# Patient Record
Sex: Female | Born: 1995 | Race: Black or African American | Hispanic: No | Marital: Single | Smoking: Former smoker
Health system: Southern US, Community
[De-identification: ages and names within clinical notes are randomized; demographics above are authoritative.]

## PROBLEM LIST (undated history)

## (undated) ENCOUNTER — Inpatient Hospital Stay (HOSPITAL_COMMUNITY): Payer: Self-pay

## (undated) DIAGNOSIS — O24419 Gestational diabetes mellitus in pregnancy, unspecified control: Secondary | ICD-10-CM

## (undated) DIAGNOSIS — Z789 Other specified health status: Secondary | ICD-10-CM

## (undated) DIAGNOSIS — K429 Umbilical hernia without obstruction or gangrene: Secondary | ICD-10-CM

## (undated) DIAGNOSIS — O139 Gestational [pregnancy-induced] hypertension without significant proteinuria, unspecified trimester: Secondary | ICD-10-CM

## (undated) DIAGNOSIS — E119 Type 2 diabetes mellitus without complications: Secondary | ICD-10-CM

## (undated) DIAGNOSIS — N12 Tubulo-interstitial nephritis, not specified as acute or chronic: Secondary | ICD-10-CM

## (undated) DIAGNOSIS — J40 Bronchitis, not specified as acute or chronic: Secondary | ICD-10-CM

## (undated) HISTORY — DX: Bronchitis, not specified as acute or chronic: J40

## (undated) HISTORY — PX: MULTIPLE TOOTH EXTRACTIONS: SHX2053

## (undated) HISTORY — DX: Type 2 diabetes mellitus without complications: E11.9

## (undated) HISTORY — PX: NO PAST SURGERIES: SHX2092

## (undated) HISTORY — DX: Umbilical hernia without obstruction or gangrene: K42.9

---
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
## 2014-07-24 ENCOUNTER — Emergency Department (HOSPITAL_COMMUNITY)
Admission: EM | Admit: 2014-07-24 | Discharge: 2014-07-24 | Disposition: A | Discharge status: Home / Self Care | Payer: Medicaid Other | Attending: Emergency Medicine | Admitting: Emergency Medicine

## 2014-07-24 ENCOUNTER — Encounter (HOSPITAL_COMMUNITY): Payer: Self-pay | Admitting: Emergency Medicine

## 2014-07-24 DIAGNOSIS — Z720 Tobacco use: Secondary | ICD-10-CM | POA: Diagnosis not present

## 2014-07-24 DIAGNOSIS — K029 Dental caries, unspecified: Secondary | ICD-10-CM

## 2014-07-24 DIAGNOSIS — Z792 Long term (current) use of antibiotics: Secondary | ICD-10-CM | POA: Insufficient documentation

## 2014-07-24 DIAGNOSIS — K088 Other specified disorders of teeth and supporting structures: Secondary | ICD-10-CM | POA: Diagnosis not present

## 2014-07-24 MED ORDER — AMOXICILLIN 500 MG PO CAPS: 500.0000 mg | ORAL_CAPSULE | Freq: Three times a day (TID) | ORAL | Status: DC

## 2014-07-24 MED ORDER — HYDROCODONE-ACETAMINOPHEN 5-325 MG PO TABS: ORAL_TABLET | ORAL | Status: DC

## 2014-07-24 MED ORDER — BUPIVACAINE-EPINEPHRINE (PF) 0.5% -1:200000 IJ SOLN
1.8000 mL | Freq: Once | INTRAMUSCULAR | Status: AC
  Administered 2014-07-24: 1.8 mL

## 2014-07-24 MED ORDER — AMOXICILLIN 500 MG PO CAPS
500.0000 mg | ORAL_CAPSULE | Freq: Once | ORAL | Status: AC
  Administered 2014-07-24: 500 mg via ORAL
  Filled 2014-07-24: qty 1

## 2014-07-24 NOTE — ED Notes (Signed)
Pt. reports left lower molar pain onset this week unrelieved by OTC pain medication .

## 2014-07-24 NOTE — ED Provider Notes (Signed)
CSN: [id number]     Arrival date & time 07/24/14  2202 History  This chart was scribed for non-physician practitioner, Wynetta Emery, PA-C, working with Gilda Crease, MD, by Bronson Curb, ED Scribe. This patient was seen in room TR04C/TR04C and the patient's care was started at 10:30 PM.     Chief Complaint  Patient presents with  . Dental Pain    The history is provided by the patient. No language interpreter was used.     HPI Comments: Debbie Mccullough is a 19 y.o. female, with no significant past medical history, who presents to the Emergency Department complaining of constant, left, lower dental pain since yesterday, that has gotten worse today. Patient rates her pain a 9/10, currently. She has taken Tylenol without relief. She reports history of the same, but states this is worse. No aggravating or alleviating factors noted. Denies fever/chills, difficulty opening jaw, difficulty swallowing, SOB, gum swelling, facial swelling, neck swelling.    History reviewed. No pertinent past medical history. History reviewed. No pertinent past surgical history. No family history on file. History  Substance Use Topics  . Smoking status: Current Every Day Smoker  . Smokeless tobacco: Not on file  . Alcohol Use: Yes   OB History    No data available     Review of Systems  A complete 10 system review of systems was obtained and all systems are negative except as noted in the HPI and PMH.    Allergies  Review of patient's allergies indicates no known allergies.  Home Medications   Prior to Admission medications   Medication Sig Start Date End Date Taking? Authorizing Provider  ibuprofen (ADVIL,MOTRIN) 200 MG tablet Take 400 mg by mouth every 6 (six) hours as needed for moderate pain.    Historical Provider, MD  penicillin v potassium (VEETID) 500 MG tablet Take 1 tablet (500 mg total) by mouth 4 (four) times daily. 04/05/13   Kaitlyn Szekalski, PA-C  traMADol (ULTRAM) 50  MG tablet Take 1 tablet (50 mg total) by mouth every 6 (six) hours as needed. 04/05/13   Emilia Beck, PA-C   Triage Vitals: BP 133/92 mmHg  Pulse 76  Temp(Src) 99.5 F (37.5 C) (Oral)  Resp 14  Ht  (1.575 m)  Wt 175 lb (79.379 kg)  BMI 32.00 kg/m2  SpO2 100%  LMP 07/18/2014  Physical Exam  Constitutional: She is oriented to person, place, and time. She appears well-developed and well-nourished. No distress.  Tearful  HENT:  Head: Normocephalic and atraumatic.  Mouth/Throat: Oropharynx is clear and moist.  Generally poor dentition, no gingival swelling, erythema or tenderness to palpation. Patient is handling their secretions. There is no tenderness to palpation or firmness underneath tongue bilaterally. No trismus.    Eyes: Conjunctivae and EOM are normal. Pupils are equal, round, and reactive to light.  Neck: Neck supple. No tracheal deviation present.  Cardiovascular: Normal rate.   Pulmonary/Chest: Effort normal. No respiratory distress.  Abdominal: Soft.  Musculoskeletal: Normal range of motion.  Neurological: She is alert and oriented to person, place, and time.  Skin: Skin is warm and dry.  Psychiatric: She has a normal mood and affect. Her behavior is normal.  Nursing note and vitals reviewed.   ED Course  NERVE BLOCK Date/Time: 07/25/2014 1:59 AM Performed by: Wynetta Emery Authorized by: Wynetta Emery Consent: Verbal consent obtained. Consent given by: patient Patient identity confirmed: verbally with patient Indications: pain relief Body area: face/mouth Nerve: inferior alveolar Laterality: left Patient  sedated: no Patient position: sitting Needle gauge: 27 G Location technique: anatomical landmarks Local anesthetic: bupivacaine 0.5% with epinephrine Anesthetic total: 1.8 ml Outcome: pain improved Patient tolerance: Patient tolerated the procedure well with no immediate complications   (including critical care time)  DIAGNOSTIC  STUDIES: Oxygen Saturation is 100% on room air, normal by my interpretation.    COORDINATION OF CARE: At 2232 Discussed treatment plan with patient which includes dental block, pain medication, ABX, and dental referral. Patient agrees.   Labs Review Labs Reviewed - No data to display  Imaging Review No results found.   EKG Interpretation None      MDM   Final diagnoses:  Pain due to dental caries    Filed Vitals:   07/24/14 2209  BP: 133/92  Pulse: 76  Temp: 99.5 F (37.5 C)  TempSrc: Oral  Resp: 14  Height: 5\' 2"  (1.575 m)  Weight: 175 lb (79.379 kg)  SpO2: 100%    Medications  bupivacaine-epinephrine (MARCAINE W/ EPI) 0.5% -1:200000 injection 1.8 mL (1.8 mLs Infiltration Given 07/24/14 2244)  amoxicillin (AMOXIL) capsule 500 mg (500 mg Oral Given 07/24/14 2244)    Debbie Mccullough is a pleasant 19 y.o. female presenting with dental pain associated with dental caries but no signs or symptoms of dental abscess. Patient afebrile, non toxic appearing and swallowing secretions well. I gave patient referral to dentist and stressed the importance of dental follow up for definitive management of dental issues. Patient voices understanding and is agreeable to plan.  tion does not show pathology that would require ongoing emergent intervention or inpatient treatment. Pt is hemodynamically stable and mentating appropriately. Discussed findings and plan with patient/guardian, who agrees with care plan. All questions answered. Return precautions discussed and outpatient follow up given.   Discharge Medication List as of 07/24/2014 10:47 PM    START taking these medications   Details  amoxicillin (AMOXIL) 500 MG capsule Take 1 capsule (500 mg total) by mouth 3 (three) times daily., Starting 07/24/2014, Until Discontinued, Print    HYDROcodone-acetaminophen (NORCO/VICODIN) 5-325 MG per tablet Take 1-2 tablets by mouth every 6 hours as needed for pain and/or cough., Print              Wynetta Emeryicole Ivaan Liddy, PA-C 07/25/14 0201  Gilda Creasehristopher J Pollina, MD 07/27/14 [phone]

## 2014-07-24 NOTE — Discharge Instructions (Signed)
Take percocet for breakthrough pain, do not drink alcohol, drive, care for children or do other critical tasks while taking percocet.  Return to the emergency room for fever, change in vision, redness to the face that rapidly spreads towards the eye, nausea or vomiting, difficulty swallowing or shortness of breath.   Apply warm compresses to jaw throughout the day.   Take your antibiotics as directed and to the end of the course.   Followup with a dentist is very important for ongoing evaluation and management of recurrent dental pain. Return to emergency department for emergent changing or worsening symptoms."  Low-cost dental clinic: [facility]**David  Civils  at [phone]**  **Nuala AlphaJanna Civils at [phone] [address]**     Dental Caries Dental caries is tooth decay. This decay can cause a hole in teeth (cavity) that can get bigger and deeper over time. HOME CARE  Brush and floss your teeth. Do this at least two times a day.  Use a fluoride toothpaste.  Use a mouth rinse if told by your dentist or doctor.  Eat less sugary and starchy foods. Drink less sugary drinks.  Avoid snacking often on sugary and starchy foods. Avoid sipping often on sugary drinks.  Keep regular checkups and cleanings with your dentist.  Use fluoride supplements if told by your dentist or doctor.  Allow fluoride to be applied to teeth if told by your dentist or doctor. Document Released: 01/14/2008 Document Revised: 08/21/2013 Document Reviewed: 04/08/2012 [facility] Patient Information 2015 [employer] [place]. This information is not intended to replace advice given to you by your health care provider. Make sure you discuss any questions you have with your health care provider.   You may also call [phone]  Dental Assistance If the dentist on-call cannot see you, please use the resources below:   Patients with Medicaid: [facility] [phone],  [phone] [address], [phone]  If unable to pay, or uninsured, contact [facility] [phone]) or [facility] [phone] in [place], [phone] in [place]) to become qualified for the adult dental clinic  Other Low-Cost Community Dental Services: [facility]- [address], [place], [place], [id number]    [phone], Ext. [phone]    2nd and 4th Thursday of the month at 6:30am    10 clients each day by appointment, can sometimes see walk-in     patients if someone does not show for an appointment [facility]- [address], [place], [place], [id number]    [phone] [facility]- [address], [place], [place], [address]    [phone]  [facility]- [phone] [facility]- [phone] [facility]- [phone]

## 2014-08-05 ENCOUNTER — Encounter (HOSPITAL_COMMUNITY): Payer: Self-pay | Admitting: *Deleted

## 2014-08-05 ENCOUNTER — Emergency Department (HOSPITAL_COMMUNITY)
Admission: EM | Admit: 2014-08-05 | Discharge: 2014-08-05 | Disposition: A | Discharge status: Home / Self Care | Payer: Medicaid Other | Attending: Emergency Medicine | Admitting: Emergency Medicine

## 2014-08-05 DIAGNOSIS — K088 Other specified disorders of teeth and supporting structures: Secondary | ICD-10-CM | POA: Diagnosis not present

## 2014-08-05 DIAGNOSIS — K0381 Cracked tooth: Secondary | ICD-10-CM | POA: Diagnosis not present

## 2014-08-05 DIAGNOSIS — K0889 Other specified disorders of teeth and supporting structures: Secondary | ICD-10-CM

## 2014-08-05 DIAGNOSIS — Z720 Tobacco use: Secondary | ICD-10-CM | POA: Diagnosis not present

## 2014-08-05 MED ORDER — PENICILLIN V POTASSIUM 500 MG PO TABS: 500.0000 mg | ORAL_TABLET | Freq: Four times a day (QID) | ORAL | Status: AC

## 2014-08-05 MED ORDER — IBUPROFEN 600 MG PO TABS: 600.0000 mg | ORAL_TABLET | Freq: Four times a day (QID) | ORAL | Status: DC | PRN

## 2014-08-05 NOTE — ED Notes (Signed)
Swollen lt face this am toothache

## 2014-08-05 NOTE — ED Notes (Signed)
Declined W/C at D/C and was escorted to lobby by RN. 

## 2014-08-05 NOTE — Discharge Instructions (Signed)
Dental Care and Dentist Visits °Dental care supports good overall health. Regular dental visits can also help you avoid dental pain, bleeding, infection, and other more serious health problems in the future. It is important to keep the mouth healthy because diseases in the teeth, gums, and other oral tissues can spread to other areas of the body. Some problems, such as diabetes, heart disease, and pre-term labor have been associated with poor oral health.  °See your dentist every 6 months. If you experience emergency problems such as a toothache or broken tooth, go to the dentist right away. If you see your dentist regularly, you may catch problems early. It is easier to be treated for problems in the early stages.  °WHAT TO EXPECT AT A DENTIST VISIT  °Your dentist will look for many common oral health problems and recommend proper treatment. At your regular dental visit, you can expect: °· Gentle cleaning of the teeth and gums. This includes scraping and polishing. This helps to remove the sticky substance around the teeth and gums (plaque). Plaque forms in the mouth shortly after eating. Over time, plaque hardens on the teeth as tartar. If tartar is not removed regularly, it can cause problems. Cleaning also helps remove stains. °· Periodic X-rays. These pictures of the teeth and supporting bone will help your dentist assess the health of your teeth. °· Periodic fluoride treatments. Fluoride is a natural mineral shown to help strengthen teeth. Fluoride treatment involves applying a fluoride gel or varnish to the teeth. It is most commonly done in children. °· Examination of the mouth, tongue, jaws, teeth, and gums to look for any oral health problems, such as: °· Cavities (dental caries). This is decay on the tooth caused by plaque, sugar, and acid in the mouth. It is best to catch a cavity when it is small. °· Inflammation of the gums caused by plaque buildup (gingivitis). °· Problems with the mouth or malformed  or misaligned teeth. °· Oral cancer or other diseases of the soft tissues or jaws.  °KEEP YOUR TEETH AND GUMS HEALTHY °For healthy teeth and gums, follow these general guidelines as well as your dentist's specific advice: °· Have your teeth professionally cleaned at the dentist every 6 months. °· Brush twice daily with a fluoride toothpaste. °· Floss your teeth daily.  °· Ask your dentist if you need fluoride supplements, treatments, or fluoride toothpaste. °· Eat a healthy diet. Reduce foods and drinks with added sugar. °· Avoid smoking. °TREATMENT FOR ORAL HEALTH PROBLEMS °If you have oral health problems, treatment varies depending on the conditions present in your teeth and gums. °· Your caregiver will most likely recommend good oral hygiene at each visit. °· For cavities, gingivitis, or other oral health disease, your caregiver will perform a procedure to treat the problem. This is typically done at a separate appointment. Sometimes your caregiver will refer you to another dental specialist for specific tooth problems or for surgery. °SEEK IMMEDIATE DENTAL CARE IF: °· You have pain, bleeding, or soreness in the gum, tooth, jaw, or mouth area. °· A permanent tooth becomes loose or separated from the gum socket. °· You experience a blow or injury to the mouth or jaw area. °Document Released: 12/17/2010 Document Revised: 06/29/2011 Document Reviewed: 12/17/2010 °ExitCare® Patient Information ©2015 [employer]. This information is not intended to replace advice given to you by your health care provider. Make sure you discuss any questions you have with your health care provider. ° °Dental Pain °A tooth ache may be caused   by cavities (tooth decay). Cavities expose the nerve of the tooth to air and hot or cold temperatures. It may come from an infection or abscess (also called a boil or furuncle) around your tooth. It is also often caused by dental caries (tooth decay). This causes the pain you are  having. °DIAGNOSIS  °Your caregiver can diagnose this problem by exam. °TREATMENT  °· If caused by an infection, it may be treated with medications which kill germs (antibiotics) and pain medications as prescribed by your caregiver. Take medications as directed. °· Only take over-the-counter or prescription medicines for pain, discomfort, or fever as directed by your caregiver. °· Whether the tooth ache today is caused by infection or dental disease, you should see your dentist as soon as possible for further care. °SEEK MEDICAL CARE IF: °The exam and treatment you received today has been provided on an emergency basis only. This is not a substitute for complete medical or dental care. If your problem worsens or new problems (symptoms) appear, and you are unable to meet with your dentist, call or return to this location. °SEEK IMMEDIATE MEDICAL CARE IF:  °· You have a fever. °· You develop redness and swelling of your face, jaw, or neck. °· You are unable to open your mouth. °· You have severe pain uncontrolled by pain medicine. °MAKE SURE YOU:  °· Understand these instructions. °· Will watch your condition. °· Will get help right away if you are not doing well or get worse. °Document Released: 04/06/2005 Document Revised: 06/29/2011 Document Reviewed: 11/23/2007 °ExitCare® Patient Information ©2015 [employer]. This information is not intended to replace advice given to you by your health care provider. Make sure you discuss any questions you have with your health care provider. ° °

## 2014-08-05 NOTE — ED Provider Notes (Signed)
CSN: [id number]     Arrival date & time 08/05/14  1804 History  This chart is scribed for non-physician practitioner, Joycie Peek, PA-C, working with Jerelyn Scott, MD by Abel Presto, ED Scribe.  This patient was seen in room TR06C/TR06C and the patient's care was started 6:24 PM.     Chief Complaint  Patient presents with  . Dental Problem     The history is provided by the patient. No language interpreter was used.   HPI Comments: Debbie Mccullough is a 19 y.o. female who presents to the Emergency Department complaining of constant 5/10 left lower dental pain with onset 13 days ago. Pt states the tooth has been cracked for 2 years. Pt notes associated jaw pain (but is able to move jaw well), and facial swelling to left side near ear with onset this afternoon. Pt was seen on 07/24/14 in ED for same and was discharged with Amoxicillin and hydrocodone. Pt has not taken medication for relief. Pt has an appointment with a dentist in 3 days.  Pt denies SOB, trouble swallowing, fever, nausea, vomiting, sore throat, and ear pain.   History reviewed. No pertinent past medical history. No past surgical history on file. No family history on file. History  Substance Use Topics  . Smoking status: Current Every Day Smoker  . Smokeless tobacco: Not on file  . Alcohol Use: Yes   OB History    No data available     Review of Systems  Constitutional: Negative for fever.  HENT: Positive for dental problem and facial swelling. Negative for ear pain, sore throat and trouble swallowing.   Respiratory: Negative for shortness of breath.   Gastrointestinal: Negative for nausea and vomiting.  All other systems reviewed and are negative.     Allergies  Review of patient's allergies indicates no known allergies.  Home Medications   Prior to Admission medications   Medication Sig Start Date End Date Taking? Authorizing Provider  amoxicillin (AMOXIL) 500 MG capsule Take 1 capsule (500 mg  total) by mouth 3 (three) times daily. 07/24/14   Nicole Pisciotta, PA-C  HYDROcodone-acetaminophen (NORCO/VICODIN) 5-325 MG per tablet Take 1-2 tablets by mouth every 6 hours as needed for pain and/or cough. 07/24/14   Nicole Pisciotta, PA-C  ibuprofen (ADVIL,MOTRIN) 600 MG tablet Take 1 tablet (600 mg total) by mouth every 6 (six) hours as needed. 08/05/14   Joycie Peek, PA-C  penicillin v potassium (VEETID) 500 MG tablet Take 1 tablet (500 mg total) by mouth 4 (four) times daily. 08/05/14 08/12/14  Joycie Peek, PA-C  traMADol (ULTRAM) 50 MG tablet Take 1 tablet (50 mg total) by mouth every 6 (six) hours as needed. 04/05/13   Kaitlyn Szekalski, PA-C   BP 114/61 mmHg  Pulse 67  Temp(Src) 99 F (37.2 C) (Oral)  Resp 20  Wt 171 lb (77.565 kg)  SpO2 99%  LMP 07/18/2014 Physical Exam  Constitutional: She is oriented to person, place, and time. She appears well-developed and well-nourished.  HENT:  Head: Normocephalic.  Right Ear: Tympanic membrane normal.  Left Ear: Tympanic membrane normal.  No tenderness over the mastoid. Mild left cheek swelling Discomfort located to the left mandibular posterior molar. No submandibular pain Mucous membranes are moist. No unilateral tonsillar swelling, uvula midline, no glossal swelling or elevation. No trismus. No fluctuance or evidence of a drainable abscess. No other evidence of emergent infection, Retropharyngeal or Peritonsillar abscess, Ludwig or Vincents angina. Tolerating secretions well. Patent airway   Eyes: Conjunctivae are normal.  Neck: Normal range of motion. Neck supple.  Pulmonary/Chest: Effort normal.  Musculoskeletal: Normal range of motion.  Lymphadenopathy:       Head (right side): No preauricular and no posterior auricular adenopathy present.       Head (left side): No preauricular and no posterior auricular adenopathy present.    She has no cervical adenopathy.  Neurological: She is alert and oriented to person, place, and  time.  Skin: Skin is warm and dry.  Psychiatric: She has a normal mood and affect. Her behavior is normal.  Nursing note and vitals reviewed.   ED Course  Procedures (including critical care time) DIAGNOSTIC STUDIES: Oxygen Saturation is 100% on room air, normal by my interpretation.    COORDINATION OF CARE: 6:30 PM Discussed treatment plan with patient at beside, the patient agrees with the plan and has no further questions at this time.   Labs Review Labs Reviewed - No data to display  Imaging Review No results found.   EKG Interpretation None     Meds given in ED:  Medications - No data to display  Discharge Medication List as of 08/05/2014  6:31 PM     Filed Vitals:   08/05/14 1811 08/05/14 1834  BP: 178/71 114/61  Pulse: 100 67  Temp: 98.6 F (37 C) 99 F (37.2 C)  TempSrc:  Oral  Resp: 18 20  Weight: 171 lb (77.565 kg)   SpO2: 100% 99%    MDM  Vitals stable - WNL -afebrile, no tachycardia on my exam heart rate mid 80s. Pt resting comfortably in ED. PE--grossly benign physical exam. Tolerating secretions, patent airway.  DDX-patient here for evaluation of dental pain. Patient with cracked posterior, left mandibular molar. Diffuse cheek swelling without erythema or warmth. No auricular adenopathy, no evidence of mastoiditis. Patient has a follow-up appointment with dentistry on Wednesday, encouraged her to keep this appointment. We'll discharge with antibiotics and anti-inflammatories for discomfort.  I discussed all relevant lab findings and imaging results with pt and they verbalized understanding. Discussed f/u with PCP within 48 hrs and return precautions, pt very amenable to plan.  Final diagnoses:  Pain, dental  I personally performed the services described in this documentation, which was scribed in my presence. The recorded information has been reviewed and is accurate.     Joycie PeekBenjamin Kuba Shepherd, PA-C 08/06/14 0100  Jerelyn ScottMartha Linker, MD 08/07/14  [phone]

## 2015-07-09 ENCOUNTER — Emergency Department (HOSPITAL_COMMUNITY)
Admission: EM | Admit: 2015-07-09 | Discharge: 2015-07-09 | Discharge status: Against Medical Advice | Payer: Medicaid Other

## 2015-07-09 NOTE — ED Notes (Signed)
Called for pt with no answer 

## 2015-07-11 ENCOUNTER — Encounter (HOSPITAL_COMMUNITY): Payer: Self-pay | Admitting: Cardiology

## 2015-07-11 ENCOUNTER — Emergency Department (HOSPITAL_COMMUNITY)
Admission: EM | Admit: 2015-07-11 | Discharge: 2015-07-11 | Disposition: A | Discharge status: Home / Self Care | Payer: Medicaid Other | Attending: Emergency Medicine | Admitting: Emergency Medicine

## 2015-07-11 ENCOUNTER — Emergency Department (HOSPITAL_COMMUNITY): Payer: Medicaid Other

## 2015-07-11 DIAGNOSIS — Y9389 Activity, other specified: Secondary | ICD-10-CM | POA: Insufficient documentation

## 2015-07-11 DIAGNOSIS — Z792 Long term (current) use of antibiotics: Secondary | ICD-10-CM | POA: Diagnosis not present

## 2015-07-11 DIAGNOSIS — Y998 Other external cause status: Secondary | ICD-10-CM | POA: Insufficient documentation

## 2015-07-11 DIAGNOSIS — S92352A Displaced fracture of fifth metatarsal bone, left foot, initial encounter for closed fracture: Secondary | ICD-10-CM | POA: Insufficient documentation

## 2015-07-11 DIAGNOSIS — X58XXXA Exposure to other specified factors, initial encounter: Secondary | ICD-10-CM | POA: Insufficient documentation

## 2015-07-11 DIAGNOSIS — Y9289 Other specified places as the place of occurrence of the external cause: Secondary | ICD-10-CM | POA: Diagnosis not present

## 2015-07-11 DIAGNOSIS — F17200 Nicotine dependence, unspecified, uncomplicated: Secondary | ICD-10-CM | POA: Insufficient documentation

## 2015-07-11 DIAGNOSIS — S92302A Fracture of unspecified metatarsal bone(s), left foot, initial encounter for closed fracture: Secondary | ICD-10-CM

## 2015-07-11 DIAGNOSIS — S99922A Unspecified injury of left foot, initial encounter: Secondary | ICD-10-CM | POA: Diagnosis present

## 2015-07-11 MED ORDER — IBUPROFEN 600 MG PO TABS: 600.0000 mg | ORAL_TABLET | Freq: Four times a day (QID) | ORAL | Status: DC | PRN

## 2015-07-11 MED ORDER — IBUPROFEN 400 MG PO TABS
800.0000 mg | ORAL_TABLET | Freq: Once | ORAL | Status: AC
  Administered 2015-07-11: 800 mg via ORAL
  Filled 2015-07-11: qty 2

## 2015-07-11 MED ORDER — IBUPROFEN 100 MG/5ML PO SUSP
600.0000 mg | Freq: Once | ORAL | Status: DC
Start: 2015-07-11 — End: 2015-07-11
  Filled 2015-07-11: qty 30

## 2015-07-11 MED ORDER — IBUPROFEN 100 MG/5ML PO SUSP: 800.0000 mg | Freq: Once | ORAL | Status: DC

## 2015-07-11 MED ORDER — IBUPROFEN 400 MG PO TABS: 600.0000 mg | ORAL_TABLET | Freq: Once | ORAL | Status: DC

## 2015-07-11 NOTE — Progress Notes (Signed)
Orthopedic Tech Progress Note Patient Details:  Debbie Mccullough Sep 20, 1995 [id number]  Ortho Devices Type of Ortho Device: Crutches, Postop shoe/boot Ortho Device/Splint Location: LLE Ortho Device/Splint Interventions: Ordered, Application   Jennye MoccasinHughes, Dunlop Craig 07/11/2015, 3:59 PM

## 2015-07-11 NOTE — Discharge Instructions (Signed)
Metatarsal Fracture A metatarsal fracture is a break in a metatarsal bone. Metatarsal bones connect your toe bones to your ankle bones. CAUSES This type of fracture may be caused by:  A sudden twisting of your foot.  A fall onto your foot.  Overuse or repetitive exercise. RISK FACTORS This condition is more likely to develop in people who:  Play contact sports.  Have a bone disease.  Have a low calcium level. SYMPTOMS Symptoms of this condition include:  Pain that is worse when walking or standing.  Pain when pressing on the foot or moving the toes.  Swelling.  Bruising on the top or bottom of the foot.  A foot that appears shorter than the other one. DIAGNOSIS This condition is diagnosed with a physical exam. You may also have imaging tests, such as:  X-rays.  A CT scan.  MRI. TREATMENT Treatment for this condition depends on its severity and whether a bone has moved out of place. Treatment may involve:  Rest.  Wearing foot support such as a cast, splint, or boot for several weeks.  Using crutches.  Surgery to move bones back into the right position. Surgery is usually needed if there are many pieces of broken bone or bones that are very out of place (displaced fracture).  Physical therapy. This may be needed to help you regain full movement and strength in your foot. You will need to return to your health care provider to have X-rays taken until your bones heal. Your health care provider will look at the X-rays to make sure that your foot is healing well. HOME CARE INSTRUCTIONS  If You Have a Cast:  Do not stick anything inside the cast to scratch your skin. Doing that increases your risk of infection.  Check the skin around the cast every day. Report any concerns to your health care provider. You may put lotion on dry skin around the edges of the cast. Do not apply lotion to the skin underneath the cast.  Keep the cast clean and dry. If You Have a Splint  or a Supportive Boot:  Wear it as directed by your health care provider. Remove it only as directed by your health care provider.  Loosen it if your toes become numb and tingle, or if they turn cold and blue.  Keep it clean and dry. Bathing  Do not take baths, swim, or use a hot tub until your health care provider approves. Ask your health care provider if you can take showers. You may only be allowed to take sponge baths for bathing.  If your health care provider approves bathing and showering, cover the cast or splint with a watertight plastic bag to protect it from water. Do not let the cast or splint get wet. Managing Pain, Stiffness, and Swelling  If directed, apply ice to the injured area (if you have a splint, not a cast).  Put ice in a plastic bag.  Place a towel between your skin and the bag.  Leave the ice on for 20 minutes, 2-3 times per day.  Move your toes often to avoid stiffness and to lessen swelling.  Raise (elevate) the injured area above the level of your heart while you are sitting or lying down. Driving  Do not drive or operate heavy machinery while taking pain medicine.  Do not drive while wearing foot support on a foot that you use for driving. Activity  Return to your normal activities as directed by your health care  provider. Ask your health care provider what activities are safe for you.  Perform exercises as directed by your health care provider or physical therapist. Safety  Do not use the injured foot to support your body weight until your health care provider says that you can. Use crutches as directed by your health care provider. General Instructions  Do not put pressure on any part of the cast or splint until it is fully hardened. This may take several hours.  Do not use any tobacco products, including cigarettes, chewing tobacco, or e-cigarettes. Tobacco can delay bone healing. If you need help quitting, ask your health care  provider.  Take medicines only as directed by your health care provider.  Keep all follow-up visits as directed by your health care provider. This is important. SEEK MEDICAL CARE IF:  You have a fever.  Your cast, splint, or boot is too loose or too tight.  Your cast, splint, or boot is damaged.  Your pain medicine is not helping.  You have pain, tingling, or numbness in your foot that is not going away. SEEK IMMEDIATE MEDICAL CARE IF:  You have severe pain.  You have tingling or numbness in your foot that is getting worse.  Your foot feels cold or becomes numb.  Your foot changes color.   This information is not intended to replace advice given to you by your health care provider. Make sure you discuss any questions you have with your health care provider.   Document Released: 12/27/2001 Document Revised: 08/21/2014 Document Reviewed: 01/31/2014 Elsevier Interactive Patient Education 2016 [employer] Cryotherapy Cryotherapy means treatment with cold. Ice or gel packs can be used to reduce both pain and swelling. Ice is the most helpful within the first 24 to 48 hours after an injury or flare-up from overusing a muscle or joint. Sprains, strains, spasms, burning pain, shooting pain, and aches can all be eased with ice. Ice can also be used when recovering from surgery. Ice is effective, has very few side effects, and is safe for most people to use. PRECAUTIONS  Ice is not a safe treatment option for people with:  Raynaud phenomenon. This is a condition affecting small blood vessels in the extremities. Exposure to cold may cause your problems to return.  Cold hypersensitivity. There are many forms of cold hypersensitivity, including:  Cold urticaria. Red, itchy hives appear on the skin when the tissues begin to warm after being iced.  Cold erythema. This is a red, itchy rash caused by exposure to cold.  Cold hemoglobinuria. Red blood cells break down when the tissues  begin to warm after being iced. The hemoglobin that carry oxygen are passed into the urine because they cannot combine with blood proteins fast enough.  Numbness or altered sensitivity in the area being iced. If you have any of the following conditions, do not use ice until you have discussed cryotherapy with your caregiver:  Heart conditions, such as arrhythmia, angina, or chronic heart disease.  High blood pressure.  Healing wounds or open skin in the area being iced.  Current infections.  Rheumatoid arthritis.  Poor circulation.  Diabetes. Ice slows the blood flow in the region it is applied. This is beneficial when trying to stop inflamed tissues from spreading irritating chemicals to surrounding tissues. However, if you expose your skin to cold temperatures for too long or without the proper protection, you can damage your skin or nerves. Watch for signs of skin damage due to cold. HOME CARE INSTRUCTIONS  Follow these tips to use ice and cold packs safely.  Place a dry or damp towel between the ice and skin. A damp towel will cool the skin more quickly, so you may need to shorten the time that the ice is used.  For a more rapid response, add gentle compression to the ice.  Ice for no more than 10 to 20 minutes at a time. The bonier the area you are icing, the less time it will take to get the benefits of ice.  Check your skin after 5 minutes to make sure there are no signs of a poor response to cold or skin damage.  Rest 20 minutes or more between uses.  Once your skin is numb, you can end your treatment. You can test numbness by very lightly touching your skin. The touch should be so light that you do not see the skin dimple from the pressure of your fingertip. When using ice, most people will feel these normal sensations in this order: cold, burning, aching, and numbness.  Do not use ice on someone who cannot communicate their responses to pain, such as small children or  people with dementia. HOW TO MAKE AN ICE PACK Ice packs are the most common way to use ice therapy. Other methods include ice massage, ice baths, and cryosprays. Muscle creams that cause a cold, tingly feeling do not offer the same benefits that ice offers and should not be used as a substitute unless recommended by your caregiver. To make an ice pack, do one of the following:  Place crushed ice or a bag of frozen vegetables in a sealable plastic bag. Squeeze out the excess air. Place this bag inside another plastic bag. Slide the bag into a pillowcase or place a damp towel between your skin and the bag.  Mix 3 parts water with 1 part rubbing alcohol. Freeze the mixture in a sealable plastic bag. When you remove the mixture from the freezer, it will be slushy. Squeeze out the excess air. Place this bag inside another plastic bag. Slide the bag into a pillowcase or place a damp towel between your skin and the bag. SEEK MEDICAL CARE IF:  You develop white spots on your skin. This may give the skin a blotchy (mottled) appearance.  Your skin turns blue or pale.  Your skin becomes waxy or hard.  Your swelling gets worse. MAKE SURE YOU:   Understand these instructions.  Will watch your condition.  Will get help right away if you are not doing well or get worse.   This information is not intended to replace advice given to you by your health care provider. Make sure you discuss any questions you have with your health care provider.   Document Released: 12/01/2010 Document Revised: 04/27/2014 Document Reviewed: 12/01/2010 Elsevier Interactive Patient Education [employer]

## 2015-07-11 NOTE — ED Notes (Signed)
Pt reports she twisted her left ankle a couple of days ago. Swelling reported

## 2015-07-11 NOTE — ED Provider Notes (Signed)
CSN: [id number]     Arrival date & time 07/11/15  1334 History  By signing my name below, I, Ronney Lion, attest that this documentation has been prepared under the direction and in the presence of Elpidio Anis, PA-C. Electronically Signed: Ronney Lion, ED Scribe. 07/11/2015. 3:55 PM.    Chief Complaint  Patient presents with  . Foot Pain   Patient is a 20 y.o. female presenting with lower extremity pain. The history is provided by the patient. No language interpreter was used.  Foot Pain This is a new problem. The current episode started 2 days ago. The problem occurs constantly. The problem has been gradually worsening. Pertinent negatives include no chest pain, no abdominal pain, no headaches and no shortness of breath. The symptoms are aggravated by walking. Nothing relieves the symptoms. She has tried nothing for the symptoms.    HPI Comments: Debbie Mccullough is a 20 y.o. female who presents to the Emergency Department complaining of sudden-onset, constant, gradually worsening, left foot pain after inverting her foot on a log while playing with her niece 2 days ago. Patient states she is able to ambulate, although she has to do so with a limp, secondary to her discomfort. No treatments or other modifying factors were noted.  History reviewed. No pertinent past medical history. History reviewed. No pertinent past surgical history. History reviewed. No pertinent family history. Social History  Substance Use Topics  . Smoking status: Current Every Day Smoker  . Smokeless tobacco: None  . Alcohol Use: Yes   OB History    No data available     Review of Systems  Respiratory: Negative for shortness of breath.   Cardiovascular: Negative for chest pain.  Gastrointestinal: Negative for abdominal pain.  Musculoskeletal: Positive for arthralgias (left foot pain).  Neurological: Negative for headaches.    Allergies  Review of patient's allergies indicates no known allergies.  Home  Medications   Prior to Admission medications   Medication Sig Start Date End Date Taking? Authorizing Provider  amoxicillin (AMOXIL) 500 MG capsule Take 1 capsule (500 mg total) by mouth 3 (three) times daily. 07/24/14   Nicole Pisciotta, PA-C  HYDROcodone-acetaminophen (NORCO/VICODIN) 5-325 MG per tablet Take 1-2 tablets by mouth every 6 hours as needed for pain and/or cough. 07/24/14   Nicole Pisciotta, PA-C  ibuprofen (ADVIL,MOTRIN) 600 MG tablet Take 1 tablet (600 mg total) by mouth every 6 (six) hours as needed. 08/05/14   Joycie Peek, PA-C  traMADol (ULTRAM) 50 MG tablet Take 1 tablet (50 mg total) by mouth every 6 (six) hours as needed. 04/05/13   Kaitlyn Szekalski, PA-C   BP 151/74 mmHg  Pulse 70  Temp(Src) 97.4 F (36.3 C) (Oral)  Resp 18  SpO2 99%  LMP 06/12/2015 Physical Exam  Constitutional: She is oriented to person, place, and time. She appears well-developed and well-nourished. No distress.  HENT:  Head: Normocephalic and atraumatic.  Eyes: Conjunctivae and EOM are normal.  Neck: Neck supple. No tracheal deviation present.  Cardiovascular: Normal rate.   Pulmonary/Chest: Effort normal. No respiratory distress.  Musculoskeletal: Normal range of motion. She exhibits tenderness.  Left foot moderately swollen at lateral forefoot. No bony deformities. No discoloration. FROM at all toes. Ankle non-tender. Tender most over the base of the fifth metatarsal.   Neurological: She is alert and oriented to person, place, and time.  Skin: Skin is warm and dry.  Psychiatric: She has a normal mood and affect. Her behavior is normal.  Nursing note and vitals  reviewed.   ED Course  Procedures (including critical care time)  DIAGNOSTIC STUDIES: Oxygen Saturation is 99% on RA, normal by my interpretation.    COORDINATION OF CARE: 2:45 PM - Discussed treatment plan with pt at bedside which includes crutches, ice, anti-inflammatories, and f/u with PCP. Pt verbalized understanding and  agreed to plan.   Imaging Review Dg Ankle Complete Left  07/11/2015  CLINICAL DATA:  Pain about the lateral aspect of the left ankle and foot for 2 days since a twisting injury. Initial encounter. EXAM: LEFT ANKLE COMPLETE - 3+ VIEW COMPARISON:  None. FINDINGS: The patient has a mildly distracted fracture of the base of the fifth metatarsal. No other bony or joint abnormality is seen. IMPRESSION: Mildly distracted fracture base of the fifth metatarsal. Electronically Signed   By: Drusilla Kannerhomas  Dalessio M.D.   On: 07/11/2015 15:09   Dg Foot Complete Left  07/11/2015  CLINICAL DATA:  Left lateral ankle pain, lateral foot pain for about 2 days, twisted ankle 2 days ago while playing with her niece EXAM: LEFT FOOT - COMPLETE 3+ VIEW COMPARISON:  Left ankle same day FINDINGS: Three views of the left foot submitted. There is small avulsion fracture at the base of fifth metatarsal. IMPRESSION: Small avulsion fracture at the base of fifth metatarsal. Electronically Signed   By: Natasha MeadLiviu  Pop M.D.   On: 07/11/2015 15:16   I have personally reviewed and evaluated these images and lab results as part of my medical decision-making.  MDM   Final diagnoses:  None  1. Left metatarsal fracture  Post-op shoe and crutches provided. Patient X-Ray positive for small avulsion fracture at the base of the fifth metatarsal. Pain managed in ED. Pt advised to follow up with PCP. Patient given brace while in ED, conservative therapy recommended and discussed. Patient will be dc home & is agreeable with above plan.   I personally performed the services described in this documentation, which was scribed in my presence. The recorded information has been reviewed and is accurate.       Elpidio AnisShari Deklyn Gibbon, PA-C 07/11/15 1612  Pricilla LovelessScott Goldston, MD 07/14/15 [phone]

## 2015-07-11 NOTE — ED Notes (Signed)
Ortho Tech called. 

## 2015-07-11 NOTE — ED Notes (Signed)
Patient returned from xray.

## 2015-07-16 ENCOUNTER — Encounter (HOSPITAL_COMMUNITY): Payer: Self-pay | Admitting: Emergency Medicine

## 2015-07-16 ENCOUNTER — Emergency Department (HOSPITAL_COMMUNITY)
Admission: EM | Admit: 2015-07-16 | Discharge: 2015-07-16 | Disposition: A | Discharge status: Home / Self Care | Payer: Medicaid Other | Attending: Emergency Medicine | Admitting: Emergency Medicine

## 2015-07-16 DIAGNOSIS — F17200 Nicotine dependence, unspecified, uncomplicated: Secondary | ICD-10-CM | POA: Diagnosis not present

## 2015-07-16 DIAGNOSIS — Z7689 Persons encountering health services in other specified circumstances: Secondary | ICD-10-CM

## 2015-07-16 DIAGNOSIS — Z0289 Encounter for other administrative examinations: Secondary | ICD-10-CM | POA: Diagnosis not present

## 2015-07-16 NOTE — ED Notes (Signed)
Pt states "i came to get a checkup so i can be released back to work". Pt fractured her foot last week and was told she could go back to work after five days but she does a lot of walking at work. Here to be seen to see if she can work again. Pt is ambulatory in triage. Denies pain.

## 2015-07-16 NOTE — Discharge Instructions (Signed)
Ankle Sprain  An ankle sprain is an injury to the strong, fibrous tissues (ligaments) that hold the bones of your ankle joint together.   CAUSES  An ankle sprain is usually caused by a fall or by twisting your ankle. Ankle sprains most commonly occur when you step on the outer edge of your foot, and your ankle turns inward. People who participate in sports are more prone to these types of injuries.   SYMPTOMS    Pain in your ankle. The pain may be present at rest or only when you are trying to stand or walk.   Swelling.   Bruising. Bruising may develop immediately or within 1 to 2 days after your injury.   Difficulty standing or walking, particularly when turning corners or changing directions.  DIAGNOSIS   Your caregiver will ask you details about your injury and perform a physical exam of your ankle to determine if you have an ankle sprain. During the physical exam, your caregiver will press on and apply pressure to specific areas of your foot and ankle. Your caregiver will try to move your ankle in certain ways. An X-ray exam may be done to be sure a bone was not broken or a ligament did not separate from one of the bones in your ankle (avulsion fracture).   TREATMENT   Certain types of braces can help stabilize your ankle. Your caregiver can make a recommendation for this. Your caregiver may recommend the use of medicine for pain. If your sprain is severe, your caregiver may refer you to a surgeon who helps to restore function to parts of your skeletal system (orthopedist) or a physical therapist.  HOME CARE INSTRUCTIONS    Apply ice to your injury for 1-2 days or as directed by your caregiver. Applying ice helps to reduce inflammation and pain.    Put ice in a plastic bag.    Place a towel between your skin and the bag.    Leave the ice on for 15-20 minutes at a time, every 2 hours while you are awake.   Only take over-the-counter or prescription medicines for pain, discomfort, or fever as directed by  your caregiver.   Elevate your injured ankle above the level of your heart as much as possible for 2-3 days.   If your caregiver recommends crutches, use them as instructed. Gradually put weight on the affected ankle. Continue to use crutches or a cane until you can walk without feeling pain in your ankle.   If you have a plaster splint, wear the splint as directed by your caregiver. Do not rest it on anything harder than a pillow for the first 24 hours. Do not put weight on it. Do not get it wet. You may take it off to take a shower or bath.   You may have been given an elastic bandage to wear around your ankle to provide support. If the elastic bandage is too tight (you have numbness or tingling in your foot or your foot becomes cold and blue), adjust the bandage to make it comfortable.   If you have an air splint, you may blow more air into it or let air out to make it more comfortable. You may take your splint off at night and before taking a shower or bath. Wiggle your toes in the splint several times per day to decrease swelling.  SEEK MEDICAL CARE IF:    You have rapidly increasing bruising or swelling.   Your toes feel   extremely cold or you lose feeling in your foot.   Your pain is not relieved with medicine.  SEEK IMMEDIATE MEDICAL CARE IF:   Your toes are numb or blue.   You have severe pain that is increasing.  MAKE SURE YOU:    Understand these instructions.   Will watch your condition.   Will get help right away if you are not doing well or get worse.     This information is not intended to replace advice given to you by your health care provider. Make sure you discuss any questions you have with your health care provider.     Document Released: 04/06/2005 Document Revised: 04/27/2014 Document Reviewed: 04/18/2011  Elsevier Interactive Patient Education 2016 [employer]

## 2015-07-16 NOTE — ED Provider Notes (Signed)
CSN: [id number]     Arrival date & time 07/16/15  1629 History  By signing my name below, I, Soijett Blue, attest that this documentation has been prepared under the direction and in the presence of Arthor Captain, PA-C Electronically Signed: Soijett Blue, ED Scribe. 07/16/2015. 4:55 PM.   Chief Complaint  Patient presents with  . Letter for School/Work      The history is provided by the patient. No language interpreter was used.     HPI Comments: Debbie Mccullough is a 20 y.o. female who presents to the Emergency Department complaining of letter for work onset today. Pt notes that she was playing with her niece when she broke her foot 5 days ago. Pt was seen in the ED for her symptoms and was placed on light duty. Pt denies seeing an orthopedist following her visit to the ED. Pt reports that she hasn't been back to work since the incident, but she has been able to ambulate. She states that she has not tried any medications for the relief for her symptoms. She denies swelling, color change, rash, wound, and any other symptoms.  Per pt chart review: Pt was seen in the ED on 07/11/2015 for left foot pain. Pt had left ankle and left foot xray completed with mildly displace fracture base of the fifth metatarsal. Pt was Rx anti-inflammatory medications and informed to follow up with her PCP for further evaluation PRN.    History reviewed. No pertinent past medical history. History reviewed. No pertinent past surgical history. No family history on file. Social History  Substance Use Topics  . Smoking status: Current Every Day Smoker  . Smokeless tobacco: None  . Alcohol Use: Yes   OB History    No data available     Review of Systems  Musculoskeletal: Positive for arthralgias. Negative for joint swelling and gait problem.  Skin: Negative for color change, rash and wound.    Allergies  Review of patient's allergies indicates no known allergies.  Home Medications   Prior to  Admission medications   Medication Sig Start Date End Date Taking? Authorizing Provider  amoxicillin (AMOXIL) 500 MG capsule Take 1 capsule (500 mg total) by mouth 3 (three) times daily. 07/24/14   Nicole Pisciotta, PA-C  HYDROcodone-acetaminophen (NORCO/VICODIN) 5-325 MG per tablet Take 1-2 tablets by mouth every 6 hours as needed for pain and/or cough. 07/24/14   Nicole Pisciotta, PA-C  ibuprofen (ADVIL,MOTRIN) 600 MG tablet Take 1 tablet (600 mg total) by mouth every 6 (six) hours as needed. 07/11/15   Elpidio Anis, PA-C  traMADol (ULTRAM) 50 MG tablet Take 1 tablet (50 mg total) by mouth every 6 (six) hours as needed. 04/05/13   Kaitlyn Szekalski, PA-C   BP 120/77 mmHg  Pulse 66  Temp(Src) 98.8 F (37.1 C) (Oral)  Resp 18  SpO2 99%  LMP 06/12/2015 Physical Exam Physical Exam  Constitutional: Pt appears well-developed and well-nourished. No distress.  HENT:  Head: Normocephalic and atraumatic.  Eyes: Conjunctivae are normal.  Neck: Normal range of motion.  Cardiovascular: Normal rate, regular rhythm and intact distal pulses.   Capillary refill < 3 sec  Pulmonary/Chest: Effort normal and breath sounds normal.  Musculoskeletal:  Pt is ambulatory with steady gait  ROM: 5/5  Neurological: Pt  is alert. Coordination normal.  Sensation 5/5 Strength 5/5  Skin: Skin is warm and dry. Pt is not diaphoretic.  No tenting of the skin  Psychiatric: Pt has a normal mood and affect.  Nursing  note and vitals reviewed.   ED Course  Procedures (including critical care time) DIAGNOSTIC STUDIES: Oxygen Saturation is 99% on RA, nl by my interpretation.    COORDINATION OF CARE: 4:46 PM Discussed treatment plan with pt at bedside and pt agreed to plan.    Labs Review Labs Reviewed - No data to display  Imaging Review No results found.    EKG Interpretation None      MDM   Final diagnoses:  Return to work evaluation    I reviewed images, the fracture is not clinically  significant, free to return to work. Ambulating normally.    I personally performed the services described in this documentation, which was scribed in my presence. The recorded information has been reviewed and is accurate.       Arthor Captainbigail Nicolaas Savo, PA-C 07/16/15 2201  Arby BarretteMarcy Pfeiffer, MD 07/17/15 [phone]

## 2016-04-20 NOTE — L&D Delivery Note (Signed)
Patient is 21 y.o. G1P0 5510w0d admitted for IOL 2/2 PreE w/o severe features, hx of A1GDM, GBS bacteruria, and Triple I.   Delivery Note At 8:32 AM a viable female was delivered via Vaginal, Spontaneous Delivery (Presentation: LOA ).  APGAR: 8, 9; weight pending.   Placenta status: Intact.  Cord: 3v  with the following complications: None .  Cord pH: N/A  Anesthesia:  Epidural Episiotomy: None Lacerations: 2nd degree Suture Repair: 3.0 Est. Blood Loss (mL): 250  Mom to postpartum.  Baby to Couplet care / Skin to Skin.  Caryl AdaJazma Phelps, DO OB Fellow Faculty Practice, [facility] [place] 11/11/2016, 9:13 AM

## 2016-08-17 ENCOUNTER — Inpatient Hospital Stay (HOSPITAL_COMMUNITY)
Admission: AD | Admit: 2016-08-17 | Discharge: 2016-08-19 | DRG: 781 | Disposition: A | Discharge status: Home / Self Care | Payer: Medicaid Other | Source: Ambulatory Visit | Attending: Family Medicine | Admitting: Family Medicine

## 2016-08-17 ENCOUNTER — Encounter (HOSPITAL_COMMUNITY): Payer: Self-pay

## 2016-08-17 DIAGNOSIS — O0932 Supervision of pregnancy with insufficient antenatal care, second trimester: Secondary | ICD-10-CM

## 2016-08-17 DIAGNOSIS — O2302 Infections of kidney in pregnancy, second trimester: Principal | ICD-10-CM | POA: Diagnosis present

## 2016-08-17 DIAGNOSIS — Z3A25 25 weeks gestation of pregnancy: Secondary | ICD-10-CM

## 2016-08-17 DIAGNOSIS — O99332 Smoking (tobacco) complicating pregnancy, second trimester: Secondary | ICD-10-CM | POA: Diagnosis present

## 2016-08-17 DIAGNOSIS — F17210 Nicotine dependence, cigarettes, uncomplicated: Secondary | ICD-10-CM | POA: Diagnosis present

## 2016-08-17 HISTORY — DX: Other specified health status: Z78.9

## 2016-08-17 LAB — URINALYSIS, ROUTINE W REFLEX MICROSCOPIC
Bilirubin Urine: NEGATIVE
GLUCOSE, UA: NEGATIVE mg/dL
KETONES UR: 80 mg/dL — AB
Nitrite: NEGATIVE
PH: 7 (ref 5.0–8.0)
Protein, ur: NEGATIVE mg/dL
SPECIFIC GRAVITY, URINE: 1.006 (ref 1.005–1.030)

## 2016-08-17 LAB — COMPREHENSIVE METABOLIC PANEL
ALK PHOS: 72 U/L (ref 38–126)
ALT: 12 U/L — AB (ref 14–54)
ANION GAP: 11 (ref 5–15)
AST: 15 U/L (ref 15–41)
Albumin: 3.1 g/dL — ABNORMAL LOW (ref 3.5–5.0)
BILIRUBIN TOTAL: 0.7 mg/dL (ref 0.3–1.2)
BUN: <5 mg/dL — ABNORMAL LOW (ref 6–20)
CALCIUM: 8.8 mg/dL — AB (ref 8.9–10.3)
CO2: 17 mmol/L — ABNORMAL LOW (ref 22–32)
CREATININE: 0.47 mg/dL (ref 0.44–1.00)
Chloride: 103 mmol/L (ref 101–111)
Glucose, Bld: 87 mg/dL (ref 65–99)
Potassium: 3.4 mmol/L — ABNORMAL LOW (ref 3.5–5.1)
SODIUM: 131 mmol/L — AB (ref 135–145)
TOTAL PROTEIN: 6.5 g/dL (ref 6.5–8.1)

## 2016-08-17 LAB — CBC WITH DIFFERENTIAL/PLATELET
Basophils Absolute: 0 10*3/uL (ref 0.0–0.1)
Basophils Relative: 0 %
EOS ABS: 0 10*3/uL (ref 0.0–0.7)
Eosinophils Relative: 0 %
HEMATOCRIT: 34.2 % — AB (ref 36.0–46.0)
HEMOGLOBIN: 11.6 g/dL — AB (ref 12.0–15.0)
LYMPHS ABS: 1.8 10*3/uL (ref 0.7–4.0)
LYMPHS PCT: 8 %
MCH: 30.4 pg (ref 26.0–34.0)
MCHC: 33.9 g/dL (ref 30.0–36.0)
MCV: 89.5 fL (ref 78.0–100.0)
MONOS PCT: 12 %
Monocytes Absolute: 2.6 10*3/uL — ABNORMAL HIGH (ref 0.1–1.0)
NEUTROS PCT: 80 %
Neutro Abs: 17.3 10*3/uL — ABNORMAL HIGH (ref 1.7–7.7)
Platelets: 222 10*3/uL (ref 150–400)
RBC: 3.82 MIL/uL — AB (ref 3.87–5.11)
RDW: 13 % (ref 11.5–15.5)
WBC: 21.7 10*3/uL — ABNORMAL HIGH (ref 4.0–10.5)

## 2016-08-17 LAB — POCT PREGNANCY, URINE: Preg Test, Ur: POSITIVE — AB

## 2016-08-17 MED ORDER — PRENATAL MULTIVITAMIN CH
1.0000 | ORAL_TABLET | Freq: Every day | ORAL | Status: DC
  Administered 2016-08-18 – 2016-08-19 (×2): 1 via ORAL
  Filled 2016-08-17 (×2): qty 1

## 2016-08-17 MED ORDER — DEXTROSE IN LACTATED RINGERS 5 % IV SOLN: INTRAVENOUS | Status: DC

## 2016-08-17 MED ORDER — DOCUSATE SODIUM 100 MG PO CAPS
100.0000 mg | ORAL_CAPSULE | Freq: Every day | ORAL | Status: DC
  Administered 2016-08-18 – 2016-08-19 (×2): 100 mg via ORAL
  Filled 2016-08-17 (×2): qty 1

## 2016-08-17 MED ORDER — ONDANSETRON HCL 4 MG/2ML IJ SOLN
4.0000 mg | Freq: Once | INTRAMUSCULAR | Status: AC
  Administered 2016-08-17: 4 mg via INTRAVENOUS
  Filled 2016-08-17: qty 2

## 2016-08-17 MED ORDER — PROMETHAZINE HCL 25 MG/ML IJ SOLN
6.2500 mg | Freq: Once | INTRAMUSCULAR | Status: AC
  Administered 2016-08-17: 6.25 mg via INTRAVENOUS
  Filled 2016-08-17: qty 1

## 2016-08-17 MED ORDER — ZOLPIDEM TARTRATE 5 MG PO TABS: 5.0000 mg | ORAL_TABLET | Freq: Every evening | ORAL | Status: DC | PRN

## 2016-08-17 MED ORDER — HYDROMORPHONE HCL 1 MG/ML IJ SOLN
0.5000 mg | Freq: Once | INTRAMUSCULAR | Status: AC
  Administered 2016-08-17: 0.5 mg via INTRAVENOUS
  Filled 2016-08-17: qty 1

## 2016-08-17 MED ORDER — DEXTROSE-NACL 5-0.9 % IV SOLN
INTRAVENOUS | Status: DC
  Administered 2016-08-17 – 2016-08-18 (×3): via INTRAVENOUS

## 2016-08-17 MED ORDER — ACETAMINOPHEN 325 MG PO TABS
650.0000 mg | ORAL_TABLET | ORAL | Status: DC | PRN
  Administered 2016-08-18 – 2016-08-19 (×3): 650 mg via ORAL
  Filled 2016-08-17 (×3): qty 2

## 2016-08-17 MED ORDER — CALCIUM CARBONATE ANTACID 500 MG PO CHEW: 2.0000 | CHEWABLE_TABLET | ORAL | Status: DC | PRN

## 2016-08-17 MED ORDER — DEXTROSE 5 % IN LACTATED RINGERS IV BOLUS
1000.0000 mL | Freq: Once | INTRAVENOUS | Status: AC
  Administered 2016-08-17: 1000 mL via INTRAVENOUS

## 2016-08-17 MED ORDER — DEXTROSE 5 % IV SOLN
2.0000 g | INTRAVENOUS | Status: DC
  Administered 2016-08-17 – 2016-08-18 (×2): 2 g via INTRAVENOUS
  Filled 2016-08-17 (×3): qty 2

## 2016-08-17 NOTE — H&P (Signed)
  Debbie Mccullough is an 21 y.o. G1P0 [redacted]w[redacted]d female.   Chief Complaint: Back pain HPI: Debbie Mccullough is a 21 y.o. G1P0 at unknown gestation who presents with right flank pain & n/v. Patient unsure of LMP; thinks maybe November or October. Took first HPT this morning, which was positive, but states she's been feeling fetal movement "for a while".  Reports right flank pain that started last night. Pain is constant & worse with position changes. Rates pain 7/10. Has not treated. Nothing makes pain better. Nausea & vomiting began this morning. Has reportedly vomited 3 times today and continues to be nauseated. Didn't check temperature at home but had hot flashes & chills today. Denies dysuria, increased frequency, or hematuria. Denies abdominal pain, vaginal bleeding, LOF.   Past Medical History:  Diagnosis Date  . Medical history non-contributory     Past Surgical History:  Procedure Laterality Date  . MULTIPLE TOOTH EXTRACTIONS      Family History  Problem Relation Age of Onset  . Birth defects Maternal Grandfather    Social History:  reports that she has been smoking Cigarettes.  She has a 1.25 pack-year smoking history. She uses smokeless tobacco. She reports that she drinks about 1.8 oz of alcohol per week . She reports that she uses drugs, including Marijuana.   No Known Allergies  No current facility-administered medications on file prior to encounter.    Current Outpatient Prescriptions on File Prior to Encounter  Medication Sig Dispense Refill  . amoxicillin (AMOXIL) 500 MG capsule Take 1 capsule (500 mg total) by mouth 3 (three) times daily. 30 capsule 0  . HYDROcodone-acetaminophen (NORCO/VICODIN) 5-325 MG per tablet Take 1-2 tablets by mouth every 6 hours as needed for pain and/or cough. 13 tablet 0  . ibuprofen (ADVIL,MOTRIN) 600 MG tablet Take 1 tablet (600 mg total) by mouth every 6 (six) hours as needed. 30 tablet 0  . traMADol (ULTRAM) 50 MG tablet Take 1 tablet (50 mg  total) by mouth every 6 (six) hours as needed. 15 tablet 0    A comprehensive review of systems was negative except for: Constitutional: positive for chills Gastrointestinal: positive for nausea and vomiting Genitourinary: positive for flank pain Musculoskeletal: positive for back pain  Blood pressure 119/67, pulse (!) 104, temperature 100 F (37.8 C), temperature source Oral, resp. rate 18, height 5' 3.75" (1.619 m), weight 180 lb (81.6 kg), last menstrual period 02/19/2016, SpO2 98 %. General appearance: alert, cooperative and appears stated age Neck: supple, symmetrical, trachea midline Back: right CVA tenderness Lungs: normal effort Heart: regular rate and rhythm Abdomen: gravid, NT Extremities: Homans sign is negative, no sign of DVT Skin: Skin color, texture, turgor normal. No rashes or lesions Neurologic: Grossly normal   Lab Results  Component Value Date   WBC 21.7 (H) 08/17/2016   HGB 11.6 (L) 08/17/2016   HCT 34.2 (L) 08/17/2016   MCV 89.5 08/17/2016   PLT 222 08/17/2016          Assessment/Plan Patient Active Problem List   Diagnosis Date Noted  . Pyelonephritis affecting pregnancy in second trimester 08/17/2016   For admission, IV Abx. F/u Urine culture  Reva Bores 08/17/2016, 9:51 PM

## 2016-08-17 NOTE — MAU Provider Note (Signed)
History     CSN: [id number]  Arrival date and time: 08/17/16 1941  First Provider Initiated Contact with Patient 08/17/16 2028      Chief Complaint  Patient presents with  . Possible Pregnancy  . Back Pain   HPI Debbie Mccullough is a 21 y.o. G1P0 at unknown gestation who presents with right flank pain & n/v. Patient unsure of LMP; thinks maybe November or October. Took first HPT this morning, which was positive, but states she's been feeling fetal movement "for a while".  Reports right flank pain that started last night. Pain is constant & worse with position changes. Rates pain 7/10. Has not treated. Nothing makes pain better. Nausea & vomiting began this morning. Has reportedly vomited 3 times today and continues to be nauseated. Didn't check temperature at home but had hot flashes & chills today. Denies dysuria, increased frequency, or hematuria. Denies abdominal pain, vaginal bleeding, LOF.   OB History    Gravida Para Term Preterm AB Living   1             SAB TAB Ectopic Multiple Live Births                  Past Medical History:  Diagnosis Date  . Medical history non-contributory     Past Surgical History:  Procedure Laterality Date  . MULTIPLE TOOTH EXTRACTIONS      Family History  Problem Relation Age of Onset  . Birth defects Maternal Grandfather     Social History  Substance Use Topics  . Smoking status: Current Every Day Smoker    Packs/day: 0.25    Years: 5.00    Types: Cigarettes  . Smokeless tobacco: Current User  . Alcohol use 1.8 oz/week    3 Cans of beer per week    Allergies: No Known Allergies  Prescriptions Prior to Admission  Medication Sig Dispense Refill Last Dose  . amoxicillin (AMOXIL) 500 MG capsule Take 1 capsule (500 mg total) by mouth 3 (three) times daily. 30 capsule 0   . HYDROcodone-acetaminophen (NORCO/VICODIN) 5-325 MG per tablet Take 1-2 tablets by mouth every 6 hours as needed for pain and/or cough. 13 tablet 0   .  ibuprofen (ADVIL,MOTRIN) 600 MG tablet Take 1 tablet (600 mg total) by mouth every 6 (six) hours as needed. 30 tablet 0   . traMADol (ULTRAM) 50 MG tablet Take 1 tablet (50 mg total) by mouth every 6 (six) hours as needed. 15 tablet 0     Review of Systems  Constitutional: Positive for chills. Negative for fever.  Gastrointestinal: Positive for nausea and vomiting. Negative for abdominal pain, constipation and diarrhea.  Genitourinary: Positive for flank pain. Negative for dysuria, frequency, hematuria, vaginal bleeding and vaginal discharge.  Musculoskeletal: Positive for back pain.   Physical Exam   Blood pressure 119/67, pulse (!) 114, temperature 100.3 F (37.9 C), temperature source Oral, resp. rate 18, height 5' 3.75" (1.619 m), weight 180 lb (81.6 kg), last menstrual period 03/20/2016, SpO2 98 %. Temp:  [99.8 F (37.7 C)-100.3 F (37.9 C)] 100 F (37.8 C) (04/30 2121) Pulse Rate:  [104-114] 104 (04/30 2130) Resp:  [18] 18 (04/30 1955) BP: (119)/(67) 119/67 (04/30 1955) SpO2:  [98 %] 98 % (04/30 2130) Weight:  [180 lb (81.6 kg)] 180 lb (81.6 kg) (04/30 1955)   Physical Exam  Nursing note and vitals reviewed. Constitutional: She is oriented to person, place, and time. She appears well-developed and well-nourished. No distress.  HENT:  Head: Normocephalic and atraumatic.  Eyes: Conjunctivae are normal. Right eye exhibits no discharge. Left eye exhibits no discharge. No scleral icterus.  Neck: Normal range of motion.  Cardiovascular: Normal rate, regular rhythm and normal heart sounds.   No murmur heard. Respiratory: Effort normal and breath sounds normal. No respiratory distress. She has no wheezes.  GI: Soft. There is CVA tenderness (right). There is no guarding.  Fundal height 29 cm  Neurological: She is alert and oriented to person, place, and time.  Skin: Skin is warm and dry. She is not diaphoretic.  Psychiatric: She has a normal mood and affect. Her behavior is  normal. Judgment and thought content normal.   Fetal Tracing:  Baseline: 155 Variability: moderate Accelerations: 10x10 Decelerations: variable  Toco: UI MAU Course  Procedures Results for orders placed or performed during the hospital encounter of 08/17/16 (from the past 24 hour(s))  Urinalysis, Routine w reflex microscopic     Status: Abnormal   Collection Time: 08/17/16  7:51 PM  Result Value Ref Range   Color, Urine YELLOW YELLOW   APPearance CLEAR CLEAR   Specific Gravity, Urine 1.006 1.005 - 1.030   pH 7.0 5.0 - 8.0   Glucose, UA NEGATIVE NEGATIVE mg/dL   Hgb urine dipstick SMALL (A) NEGATIVE   Bilirubin Urine NEGATIVE NEGATIVE   Ketones, ur 80 (A) NEGATIVE mg/dL   Protein, ur NEGATIVE NEGATIVE mg/dL   Nitrite NEGATIVE NEGATIVE   Leukocytes, UA LARGE (A) NEGATIVE   RBC / HPF 0-5 0 - 5 RBC/hpf   WBC, UA TOO NUMEROUS TO COUNT 0 - 5 WBC/hpf   Bacteria, UA MANY (A) NONE SEEN   Squamous Epithelial / LPF 0-5 (A) NONE SEEN   Mucous PRESENT   Pregnancy, urine POC     Status: Abnormal   Collection Time: 08/17/16  8:04 PM  Result Value Ref Range   Preg Test, Ur POSITIVE (A) NEGATIVE  CBC with Differential/Platelet     Status: Abnormal   Collection Time: 08/17/16  9:00 PM  Result Value Ref Range   WBC 21.7 (H) 4.0 - 10.5 K/uL   RBC 3.82 (L) 3.87 - 5.11 MIL/uL   Hemoglobin 11.6 (L) 12.0 - 15.0 g/dL   HCT 16.1 (L) 09.6 - 04.5 %   MCV 89.5 78.0 - 100.0 fL   MCH 30.4 26.0 - 34.0 pg   MCHC 33.9 30.0 - 36.0 g/dL   RDW 40.9 81.1 - 91.4 %   Platelets 222 150 - 400 K/uL   Neutrophils Relative % 80 %   Neutro Abs 17.3 (H) 1.7 - 7.7 K/uL   Lymphocytes Relative 8 %   Lymphs Abs 1.8 0.7 - 4.0 K/uL   Monocytes Relative 12 %   Monocytes Absolute 2.6 (H) 0.1 - 1.0 K/uL   Eosinophils Relative 0 %   Eosinophils Absolute 0.0 0.0 - 0.7 K/uL   Basophils Relative 0 %   Basophils Absolute 0.0 0.0 - 0.1 K/uL    MDM FHT 160 Fundal height 29 cm, placed on fetal monitor IV D5LR bolus,  zofran 4 mg, dilaudid 0.5 mg CBC, CMP CBC -- WBC 21.7 S/w Dr. Shawnie Pons. Will admit for pyelo Assessment and Plan  A; 1. Pyelonephritis affecting pregnancy in second trimester    P: Admit for IV abx  Judeth Horn 08/17/2016, 8:27 PM

## 2016-08-17 NOTE — MAU Note (Signed)
Pt c/o right flank pain that started last night. States that pain is constant, sharp and radiates to her side. Has had some nausea and vomited 3 times today. Pt denies vaginal bleeding. Pt denies urinary s/s. States she had a +upt today. LMP: about 3 months ago.

## 2016-08-18 DIAGNOSIS — Z3A25 25 weeks gestation of pregnancy: Secondary | ICD-10-CM | POA: Diagnosis not present

## 2016-08-18 DIAGNOSIS — R109 Unspecified abdominal pain: Secondary | ICD-10-CM | POA: Diagnosis present

## 2016-08-18 DIAGNOSIS — O99332 Smoking (tobacco) complicating pregnancy, second trimester: Secondary | ICD-10-CM | POA: Diagnosis present

## 2016-08-18 DIAGNOSIS — O0932 Supervision of pregnancy with insufficient antenatal care, second trimester: Secondary | ICD-10-CM | POA: Diagnosis not present

## 2016-08-18 DIAGNOSIS — O2302 Infections of kidney in pregnancy, second trimester: Secondary | ICD-10-CM | POA: Diagnosis present

## 2016-08-18 DIAGNOSIS — F17210 Nicotine dependence, cigarettes, uncomplicated: Secondary | ICD-10-CM | POA: Diagnosis present

## 2016-08-18 NOTE — Progress Notes (Signed)
FACULTY PRACTICE ANTEPARTUM(COMPREHENSIVE) NOTE  Debbie Mccullough is a 21 y.o. G1P0 at [redacted]w[redacted]d  who is admitted for pyelonephritis.    Fetal presentation is unsure. Length of Stay:  0  Days  Date of admission:08/17/2016  Subjective: Patient reports improvement in her pain. She is tolerating a regular diet Patient reports the fetal movement as active. Patient reports uterine contraction  activity as none. Patient reports  vaginal bleeding as none. Patient describes fluid per vagina as None.  Vitals:  Blood pressure 117/65, pulse 98, temperature 98.2 F (36.8 C), temperature source Oral, resp. rate 18, height 5' 3.75" (1.619 m), weight 180 lb (81.6 kg), last menstrual period 02/19/2016, SpO2 100 %. Vitals:   08/17/16 2259 08/18/16 0030 08/18/16 0400 08/18/16 0850  BP:  120/60 118/64 117/65  Pulse: (!) 101 100 100 98  Resp:  Temp:   97.7 F (36.5 C) 98.2 F (36.8 C)  TempSrc:   Oral Oral  SpO2:   100% 100%  Weight:      Height:       Physical Examination:  General appearance - alert, well appearing, and in no distress Fundal Height:  size equals dates Pelvic Exam:  examination not indicated Cervical Exam: Not evaluated.  Extremities: Homans sign is negative, no sign of DVT with DTRs 2+ bilaterally Back: +CVA tenderness  Fetal Monitoring:  Baseline: 150 bpm, Variability: Good {> 6 bpm), Accelerations: Non-reactive but appropriate for gestational age and Decelerations: Absent     Labs:  Results for orders placed or performed during the hospital encounter of 08/17/16 (from the past 24 hour(s))  Urinalysis, Routine w reflex microscopic   Collection Time: 08/17/16  7:51 PM  Result Value Ref Range   Color, Urine YELLOW YELLOW   APPearance CLEAR CLEAR   Specific Gravity, Urine 1.006 1.005 - 1.030   pH 7.0 5.0 - 8.0   Glucose, UA NEGATIVE NEGATIVE mg/dL   Hgb urine dipstick SMALL (A) NEGATIVE   Bilirubin Urine NEGATIVE NEGATIVE   Ketones, ur 80 (A) NEGATIVE mg/dL   Protein, ur NEGATIVE NEGATIVE mg/dL   Nitrite NEGATIVE NEGATIVE   Leukocytes, UA LARGE (A) NEGATIVE   RBC / HPF 0-5 0 - 5 RBC/hpf   WBC, UA TOO NUMEROUS TO COUNT 0 - 5 WBC/hpf   Bacteria, UA MANY (A) NONE SEEN   Squamous Epithelial / LPF 0-5 (A) NONE SEEN   Mucous PRESENT   Pregnancy, urine POC   Collection Time: 08/17/16  8:04 PM  Result Value Ref Range   Preg Test, Ur POSITIVE (A) NEGATIVE  Comprehensive metabolic panel   Collection Time: 08/17/16  8:51 PM  Result Value Ref Range   Sodium 131 (L) 135 - 145 mmol/L   Potassium 3.4 (L) 3.5 - 5.1 mmol/L   Chloride 103 101 - 111 mmol/L   CO2 17 (L) 22 - 32 mmol/L   Glucose, Bld 87 65 - 99 mg/dL   BUN <5 (L) 6 - 20 mg/dL   Creatinine, Ser 4.69 0.44 - 1.00 mg/dL   Calcium 8.8 (L) 8.9 - 10.3 mg/dL   Total Protein 6.5 6.5 - 8.1 g/dL   Albumin 3.1 (L) 3.5 - 5.0 g/dL   AST 15 15 - 41 U/L   ALT 12 (L) 14 - 54 U/L   Alkaline Phosphatase 72 38 - 126 U/L   Total Bilirubin 0.7 0.3 - 1.2 mg/dL   GFR calc non Af Amer >60 >60 mL/min   GFR calc Af Amer >60 >60 mL/min  Anion gap 11 5 - 15  CBC with Differential/Platelet   Collection Time: 08/17/16  9:00 PM  Result Value Ref Range   WBC 21.7 (H) 4.0 - 10.5 K/uL   RBC 3.82 (L) 3.87 - 5.11 MIL/uL   Hemoglobin 11.6 (L) 12.0 - 15.0 g/dL   HCT 16.1 (L) 09.6 - 04.5 %   MCV 89.5 78.0 - 100.0 fL   MCH 30.4 26.0 - 34.0 pg   MCHC 33.9 30.0 - 36.0 g/dL   RDW 40.9 81.1 - 91.4 %   Platelets 222 150 - 400 K/uL   Neutrophils Relative % 80 %   Neutro Abs 17.3 (H) 1.7 - 7.7 K/uL   Lymphocytes Relative 8 %   Lymphs Abs 1.8 0.7 - 4.0 K/uL   Monocytes Relative 12 %   Monocytes Absolute 2.6 (H) 0.1 - 1.0 K/uL   Eosinophils Relative 0 %   Eosinophils Absolute 0.0 0.0 - 0.7 K/uL   Basophils Relative 0 %   Basophils Absolute 0.0 0.0 - 0.1 K/uL     Medications:  Scheduled . docusate sodium  100 mg Oral Daily  . prenatal multivitamin  1 tablet Oral Q1200   I have reviewed the patient's current  medications.  ASSESSMENT: G1P0 [redacted]w[redacted]d Estimated Date of Delivery: 11/25/16  Patient Active Problem List   Diagnosis Date Noted  . Pyelonephritis affecting pregnancy in second trimester 08/17/2016    PLAN: 21 yo G1P0 at [redacted]w[redacted]d with pyelonephritis - Continue IV antibiotics - Stable maternal/fetal unit - Continue current antepartum care - Patient plans to establish care with [facility]- will set up new ob appointment for her  Debbie Mccullough 08/18/2016,9:11 AM

## 2016-08-19 DIAGNOSIS — O2302 Infections of kidney in pregnancy, second trimester: Principal | ICD-10-CM

## 2016-08-19 LAB — CULTURE, OB URINE: Culture: >=100000 — AB

## 2016-08-19 LAB — CBC
HEMATOCRIT: 31.7 % — AB (ref 36.0–46.0)
HEMOGLOBIN: 10.6 g/dL — AB (ref 12.0–15.0)
MCH: 30.2 pg (ref 26.0–34.0)
MCHC: 33.4 g/dL (ref 30.0–36.0)
MCV: 90.3 fL (ref 78.0–100.0)
Platelets: 220 10*3/uL (ref 150–400)
RBC: 3.51 MIL/uL — AB (ref 3.87–5.11)
RDW: 13.2 % (ref 11.5–15.5)
WBC: 14.7 10*3/uL — AB (ref 4.0–10.5)

## 2016-08-19 MED ORDER — CEPHALEXIN 500 MG PO CAPS: 500.0000 mg | ORAL_CAPSULE | Freq: Four times a day (QID) | ORAL | 2 refills | Status: DC

## 2016-08-19 MED ORDER — PREPLUS 27-1 MG PO TABS: 1.0000 | ORAL_TABLET | Freq: Every day | ORAL | 13 refills | Status: DC

## 2016-08-19 NOTE — Discharge Summary (Signed)
Antenatal Physician Discharge Summary  Patient ID: Debbie Mccullough MRN: [id number] DOB/AGE: 20-Sep-1995 21 y.o.  Admit date: 08/17/2016 Discharge date: 08/19/2016  Admission Diagnoses: Pyelonephritis in the second trimester  Discharge Diagnoses: The same  Prenatal Procedures: none   Hospital Course: Debbie Mccullough is 21 y.o. G1P0 at [redacted]w[redacted]d who was admitted for pyelonephritis.  She was started on IV Rocephin, also received IV hydration. . Patient was afebrile for her entire hospitalization and her CVAT improved significantly with oral analgesia.   Her antibiotic regimen was switched to oral Keflex. The fetal heart rate monitoring remained reassuring, and she had no signs/symptoms of progressing preterm labor or other maternal-fetal concerns.She was deemed stable for discharge to home with outpatient follow up. Urine culture was pending at time of discharge, will follow up to confirm sensitivities.  Discharge Exam: Temp:  [98.1 F (36.7 C)-99.2 F (37.3 C)] 98.1 F (36.7 C) (05/02 0914) Pulse Rate:  [90-107] 93 (05/02 0914) Resp:  [14-18] 18 (05/02 0914) BP: (110-133)/(47-70) 121/62 (05/02 0914) SpO2:  [99 %-100 %] 99 % (05/02 0914) Physical Examination: CONSTITUTIONAL: Well-developed, well-nourished female in no acute distress.  HENT:  Normocephalic, atraumatic, External right and left ear normal. Oropharynx is clear and moist EYES: Conjunctivae and EOM are normal. Pupils are equal, round, and reactive to light. No scleral icterus.  NECK: Normal range of motion, supple, no masses SKIN: Skin is warm and dry. No rash noted. Not diaphoretic. No erythema. No pallor. NEUROLGIC: Alert and oriented to person, place, and time. Normal reflexes, muscle tone coordination. No cranial nerve deficit noted. PSYCHIATRIC: Normal mood and affect. Normal behavior. Normal judgment and thought content. CARDIOVASCULAR: Normal heart rate noted, regular rhythm RESPIRATORY: Effort and breath sounds normal, no  problems with respiration noted MUSCULOSKELETAL: Normal range of motion. No edema and no tenderness. 2+ distal pulses. ABDOMEN: Soft, nontender, nondistended, gravid. CERVIX:    Fetal monitoring: FHR: 145 bpm, Variability: moderate, Accelerations: 10 x10 Present, Decelerations: Absent  Uterine activity: No contractions  Significant Diagnostic Studies:  Results for orders placed or performed during the hospital encounter of 08/17/16 (from the past 168 hour(s))  Culture, OB Urine   Collection Time: 08/17/16  7:51 PM  Result Value Ref Range   Specimen Description OB CLEAN CATCH    Special Requests NONE    Culture      CULTURE REINCUBATED FOR BETTER GROWTH Performed at [facility], [address] [place], [place] [address]    Report Status PENDING   Urinalysis, Routine w reflex microscopic   Collection Time: 08/17/16  7:51 PM  Result Value Ref Range   Color, Urine YELLOW YELLOW   APPearance CLEAR CLEAR   Specific Gravity, Urine 1.006 1.005 - 1.030   pH 7.0 5.0 - 8.0   Glucose, UA NEGATIVE NEGATIVE mg/dL   Hgb urine dipstick SMALL (A) NEGATIVE   Bilirubin Urine NEGATIVE NEGATIVE   Ketones, ur 80 (A) NEGATIVE mg/dL   Protein, ur NEGATIVE NEGATIVE mg/dL   Nitrite NEGATIVE NEGATIVE   Leukocytes, UA LARGE (A) NEGATIVE   RBC / HPF 0-5 0 - 5 RBC/hpf   WBC, UA TOO NUMEROUS TO COUNT 0 - 5 WBC/hpf   Bacteria, UA MANY (A) NONE SEEN   Squamous Epithelial / LPF 0-5 (A) NONE SEEN   Mucous PRESENT   Pregnancy, urine POC   Collection Time: 08/17/16  8:04 PM  Result Value Ref Range   Preg Test, Ur POSITIVE (A) NEGATIVE  Comprehensive metabolic panel   Collection Time: 08/17/16  8:51 PM  Result Value Ref Range   Sodium 131 (L) 135 - 145 mmol/L   Potassium 3.4 (L) 3.5 - 5.1 mmol/L   Chloride 103 101 - 111 mmol/L   CO2 17 (L) 22 - 32 mmol/L   Glucose, Bld 87 65 - 99 mg/dL   BUN <5 (L) 6 - 20 mg/dL   Creatinine, Ser 1.61 0.44 - 1.00 mg/dL   Calcium 8.8 (L) 8.9 - 10.3 mg/dL    Total Protein 6.5 6.5 - 8.1 g/dL   Albumin 3.1 (L) 3.5 - 5.0 g/dL   AST 15 15 - 41 U/L   ALT 12 (L) 14 - 54 U/L   Alkaline Phosphatase 72 38 - 126 U/L   Total Bilirubin 0.7 0.3 - 1.2 mg/dL   GFR calc non Af Amer >60 >60 mL/min   GFR calc Af Amer >60 >60 mL/min   Anion gap 11 5 - 15  CBC with Differential/Platelet   Collection Time: 08/17/16  9:00 PM  Result Value Ref Range   WBC 21.7 (H) 4.0 - 10.5 K/uL   RBC 3.82 (L) 3.87 - 5.11 MIL/uL   Hemoglobin 11.6 (L) 12.0 - 15.0 g/dL   HCT 09.6 (L) 04.5 - 40.9 %   MCV 89.5 78.0 - 100.0 fL   MCH 30.4 26.0 - 34.0 pg   MCHC 33.9 30.0 - 36.0 g/dL   RDW 81.1 91.4 - 78.2 %   Platelets 222 150 - 400 K/uL   Neutrophils Relative % 80 %   Neutro Abs 17.3 (H) 1.7 - 7.7 K/uL   Lymphocytes Relative 8 %   Lymphs Abs 1.8 0.7 - 4.0 K/uL   Monocytes Relative 12 %   Monocytes Absolute 2.6 (H) 0.1 - 1.0 K/uL   Eosinophils Relative 0 %   Eosinophils Absolute 0.0 0.0 - 0.7 K/uL   Basophils Relative 0 %   Basophils Absolute 0.0 0.0 - 0.1 K/uL  CBC   Collection Time: 08/19/16  8:00 AM  Result Value Ref Range   WBC 14.7 (H) 4.0 - 10.5 K/uL   RBC 3.51 (L) 3.87 - 5.11 MIL/uL   Hemoglobin 10.6 (L) 12.0 - 15.0 g/dL   HCT 95.6 (L) 21.3 - 08.6 %   MCV 90.3 78.0 - 100.0 fL   MCH 30.2 26.0 - 34.0 pg   MCHC 33.4 30.0 - 36.0 g/dL   RDW 57.8 46.9 - 62.9 %   Platelets 220 150 - 400 K/uL    Discharge Condition: Stable  Disposition: 01-Home or Self Care    Allergies as of 08/19/2016   No Known Allergies     Medication List    TAKE these medications   cephALEXin 500 MG capsule Commonly known as:  KEFLEX Take 1 capsule (500 mg total) by mouth 4 (four) times daily.   PREPLUS 27-1 MG Tabs Take 1 tablet by mouth daily.      Follow-up Information    Wynelle Bourgeois, CNM Follow up on 09/03/2016.   Specialty:  Obstetrics and Gynecology Why:  7:40 am for initial OB appointment Contact information: [address] [place]  [address] [phone]        [facility] Follow up on 08/31/2016.   Specialty:  Maternal and Fetal Medicine Why:  3:45 pm for ultrasound Contact information: [address] [id number] mc [place] [address] [phone]          Signed: Jaynie Collins M.D. 08/19/2016, 11:46 AM

## 2016-08-19 NOTE — Progress Notes (Signed)
CRITICAL VALUE ALERT  Critical value received:  OB urine, GBS >100,000  Date of notification:  08/19/16   Time of notification: 1239  Critical value read back:Yes.    Nurse who received alert:  A. Roger Shelter   MD notified (1st page): [email or web address]    Time of first page:  1240  MD notified (2nd page):  Time of second page:  Responding MD:  Anyanwu  Time MD responded:  1240

## 2016-08-19 NOTE — Progress Notes (Signed)
Pt verbalizes understanding of d/c instructions, medications, follow up appts, when to seek medical attention,&  belongings policy. No questions at this time. Pts IV was d/c without complications. Pt reminded to check room for her belongings. Pt ambulated to main entrance, accompanied by this RN. Pts grandmother is driving her home. Sheryn Bison

## 2016-08-19 NOTE — Plan of Care (Signed)
Problem: Pain Managment: Goal: General experience of comfort will improve Outcome: Progressing Pt denies any pain at this time.   Problem: Physical Regulation: Goal: Ability to maintain clinical measurements within normal limits will improve Outcome: Progressing VS WNL & WBC is trending down today.  Problem: Tissue Perfusion: Goal: Risk factors for ineffective tissue perfusion will decrease Outcome: Progressing Pt is compliant with SCDs while in bed.

## 2016-08-19 NOTE — Progress Notes (Signed)
FHT appeared to drop to approx 100 while removing monitors at 1214, I did not see this when removing monitors, but did notice it when charting. Monitors reapplied for continued monitoring/reassurance for aprox 15 min 1227-1240. No issues during reassessment. Dr. Macon Large aware. Sheryn Bison

## 2016-08-20 ENCOUNTER — Encounter: Payer: Self-pay | Admitting: Advanced Practice Midwife

## 2016-08-20 DIAGNOSIS — O2343 Unspecified infection of urinary tract in pregnancy, third trimester: Secondary | ICD-10-CM | POA: Insufficient documentation

## 2016-08-20 DIAGNOSIS — O2342 Unspecified infection of urinary tract in pregnancy, second trimester: Secondary | ICD-10-CM

## 2016-08-20 DIAGNOSIS — B951 Streptococcus, group B, as the cause of diseases classified elsewhere: Secondary | ICD-10-CM | POA: Insufficient documentation

## 2016-08-31 ENCOUNTER — Other Ambulatory Visit (HOSPITAL_COMMUNITY): Payer: Self-pay | Admitting: Obstetrics & Gynecology

## 2016-08-31 ENCOUNTER — Ambulatory Visit (HOSPITAL_COMMUNITY)
Admission: RE | Admit: 2016-08-31 | Discharge: 2016-08-31 | Disposition: A | Discharge status: Home / Self Care | Payer: Medicaid Other | Source: Ambulatory Visit | Attending: Obstetrics & Gynecology | Admitting: Obstetrics & Gynecology

## 2016-08-31 DIAGNOSIS — Z3A27 27 weeks gestation of pregnancy: Secondary | ICD-10-CM

## 2016-08-31 DIAGNOSIS — O0932 Supervision of pregnancy with insufficient antenatal care, second trimester: Secondary | ICD-10-CM

## 2016-08-31 DIAGNOSIS — Z363 Encounter for antenatal screening for malformations: Secondary | ICD-10-CM

## 2016-08-31 DIAGNOSIS — O99332 Smoking (tobacco) complicating pregnancy, second trimester: Secondary | ICD-10-CM

## 2016-08-31 DIAGNOSIS — Z3687 Encounter for antenatal screening for uncertain dates: Secondary | ICD-10-CM | POA: Insufficient documentation

## 2016-08-31 DIAGNOSIS — Z3402 Encounter for supervision of normal first pregnancy, second trimester: Secondary | ICD-10-CM

## 2016-09-03 ENCOUNTER — Other Ambulatory Visit (HOSPITAL_COMMUNITY)
Admission: RE | Admit: 2016-09-03 | Discharge: 2016-09-03 | Disposition: A | Discharge status: Home / Self Care | Payer: Medicaid Other | Source: Ambulatory Visit | Attending: Advanced Practice Midwife | Admitting: Advanced Practice Midwife

## 2016-09-03 ENCOUNTER — Encounter: Payer: Self-pay | Admitting: Advanced Practice Midwife

## 2016-09-03 ENCOUNTER — Ambulatory Visit (INDEPENDENT_AMBULATORY_CARE_PROVIDER_SITE_OTHER): Payer: Medicaid Other | Admitting: Advanced Practice Midwife

## 2016-09-03 VITALS — BP 130/74 | HR 84 | Wt 187.1 lb

## 2016-09-03 DIAGNOSIS — Z3482 Encounter for supervision of other normal pregnancy, second trimester: Secondary | ICD-10-CM

## 2016-09-03 DIAGNOSIS — B373 Candidiasis of vulva and vagina: Secondary | ICD-10-CM | POA: Diagnosis not present

## 2016-09-03 DIAGNOSIS — Z3400 Encounter for supervision of normal first pregnancy, unspecified trimester: Secondary | ICD-10-CM | POA: Insufficient documentation

## 2016-09-03 DIAGNOSIS — Z87448 Personal history of other diseases of urinary system: Secondary | ICD-10-CM

## 2016-09-03 DIAGNOSIS — Z87440 Personal history of urinary (tract) infections: Secondary | ICD-10-CM | POA: Diagnosis not present

## 2016-09-03 DIAGNOSIS — O0993 Supervision of high risk pregnancy, unspecified, third trimester: Secondary | ICD-10-CM | POA: Insufficient documentation

## 2016-09-03 DIAGNOSIS — F17200 Nicotine dependence, unspecified, uncomplicated: Secondary | ICD-10-CM | POA: Insufficient documentation

## 2016-09-03 DIAGNOSIS — Z87891 Personal history of nicotine dependence: Secondary | ICD-10-CM

## 2016-09-03 LAB — POCT URINALYSIS DIP (DEVICE)
BILIRUBIN URINE: NEGATIVE
GLUCOSE, UA: NEGATIVE mg/dL
Hgb urine dipstick: NEGATIVE
Ketones, ur: NEGATIVE mg/dL
NITRITE: NEGATIVE
Protein, ur: NEGATIVE mg/dL
Specific Gravity, Urine: 1.015 (ref 1.005–1.030)
Urobilinogen, UA: 0.2 mg/dL (ref 0.0–1.0)
pH: 7 (ref 5.0–8.0)

## 2016-09-03 NOTE — Progress Notes (Signed)
Here for first prenatal visit. Given prenatal education booklets. Signed up for [facility] app only. Declined flu  And tdap.

## 2016-09-03 NOTE — Progress Notes (Signed)
  Subjective:    Debbie Mccullough is a G1P0 4946w1d being seen today for her first obstetrical visit.  Her obstetrical history is significant for obesity and former smoker (quit w/+ UPT). Recent episode of pyelonephritis.   Patient does not intend to breast feed. Pregnancy history fully reviewed.  Patient reports no complaints.  Vitals:   09/03/16 0804  BP: 130/74  Pulse: 84  Weight: 187 lb 1.6 oz (84.9 kg)    HISTORY: OB History  Gravida Para Term Preterm AB Living  1            SAB TAB Ectopic Multiple Live Births               # Outcome Date GA Lbr Len/2nd Weight Sex Delivery Anes PTL Lv  1 Current              Past Medical History:  Diagnosis Date  . Medical history non-contributory    Past Surgical History:  Procedure Laterality Date  . MULTIPLE TOOTH EXTRACTIONS     Family History  Problem Relation Age of Onset  . Birth defects Maternal Grandfather      Exam    Uterus:     Pelvic Exam:    Perineum: No Hemorrhoids, Normal Perineum   Vulva: Bartholin's, Urethra, Skene's normal   Vagina:  normal mucosa, normal discharge, no palpable nodules   pH:    Cervix: no bleeding following Pap and no cervical motion tenderness   Adnexa: no mass, fullness, tenderness   Bony Pelvis: gynecoid  System: Breast:  normal appearance, no masses or tenderness   Skin: normal coloration and turgor, no rashes    Neurologic: oriented, grossly non-focal, normal mood   Extremities: normal strength, tone, and muscle mass   HEENT neck supple with midline trachea   Mouth/Teeth mucous membranes moist, pharynx normal without lesions   Neck supple and no masses   Cardiovascular: regular rate and rhythm   Respiratory:  appears well, vitals normal, no respiratory distress, acyanotic, normal RR, ear and throat exam is normal, neck free of mass or lymphadenopathy, chest clear, no wheezing, crepitations, rhonchi, normal symmetric air entry   Abdomen: soft, non-tender; bowel sounds normal; no  masses,  no organomegaly   Urinary: urethral meatus normal      Assessment:    Pregnancy: G1P0 Patient Active Problem List   Diagnosis Date Noted  . Supervision of normal first pregnancy, antepartum 09/03/2016  . GBS (group b Streptococcus) UTI complicating pregnancy, second trimester 08/20/2016  . Pyelonephritis affecting pregnancy in second trimester 08/17/2016        Plan:     Initial labs drawn. Prenatal vitamins. Problem list reviewed and updated. Genetic Screening discussed Quad Screen: Too Late.  Ultrasound discussed; fetal survey: results reviewed.  Follow up in 2 weeks. 50% of 30 min visit spent on counseling and coordination of care.  Routines reviewed, welcomed to practice Tolerated first pap and pelvic well Discussed classes and tour Reviewed use of MAU   Wynelle BourgeoisMarie Esvin Hnat 09/03/2016

## 2016-09-03 NOTE — Patient Instructions (Signed)
Third Trimester of Pregnancy The third trimester is from week 28 through week 40 (months 7 through 9). The third trimester is a time when the unborn baby (fetus) is growing rapidly. At the end of the ninth month, the fetus is about 20 inches in length and weighs 6-10 pounds. Body changes during your third trimester Your body will continue to go through many changes during pregnancy. The changes vary from woman to woman. During the third trimester:  Your weight will continue to increase. You can expect to gain 25-35 pounds (11-16 kg) by the end of the pregnancy.  You may begin to get stretch marks on your hips, abdomen, and breasts.  You may urinate more often because the fetus is moving lower into your pelvis and pressing on your bladder.  You may develop or continue to have heartburn. This is caused by increased hormones that slow down muscles in the digestive tract.  You may develop or continue to have constipation because increased hormones slow digestion and cause the muscles that push waste through your intestines to relax.  You may develop hemorrhoids. These are swollen veins (varicose veins) in the rectum that can itch or be painful.  You may develop swollen, bulging veins (varicose veins) in your legs.  You may have increased body aches in the pelvis, back, or thighs. This is due to weight gain and increased hormones that are relaxing your joints.  You may have changes in your hair. These can include thickening of your hair, rapid growth, and changes in texture. Some women also have hair loss during or after pregnancy, or hair that feels dry or thin. Your hair will most likely return to normal after your baby is born.  Your breasts will continue to grow and they will continue to become tender. A yellow fluid (colostrum) may leak from your breasts. This is the first milk you are producing for your baby.  Your belly button may stick out.  You may notice more swelling in your hands,  face, or ankles.  You may have increased tingling or numbness in your hands, arms, and legs. The skin on your belly may also feel numb.  You may feel short of breath because of your expanding uterus.  You may have more problems sleeping. This can be caused by the size of your belly, increased need to urinate, and an increase in your body's metabolism.  You may notice the fetus "dropping," or moving lower in your abdomen (lightening).  You may have increased vaginal discharge.  You may notice your joints feel loose and you may have pain around your pelvic bone.  What to expect at prenatal visits You will have prenatal exams every 2 weeks until week 36. Then you will have weekly prenatal exams. During a routine prenatal visit:  You will be weighed to make sure you and the baby are growing normally.  Your blood pressure will be taken.  Your abdomen will be measured to track your baby's growth.  The fetal heartbeat will be listened to.  Any test results from the previous visit will be discussed.  You may have a cervical check near your due date to see if your cervix has softened or thinned (effaced).  You will be tested for Group B streptococcus. This happens between 35 and 37 weeks.  Your health care provider may ask you:  What your birth plan is.  How you are feeling.  If you are feeling the baby move.  If you have had   any abnormal symptoms, such as leaking fluid, bleeding, severe headaches, or abdominal cramping.  If you are using any tobacco products, including cigarettes, chewing tobacco, and electronic cigarettes.  If you have any questions.  Other tests or screenings that may be performed during your third trimester include:  Blood tests that check for low iron levels (anemia).  Fetal testing to check the health, activity level, and growth of the fetus. Testing is done if you have certain medical conditions or if there are problems during the  pregnancy.  Nonstress test (NST). This test checks the health of your baby to make sure there are no signs of problems, such as the baby not getting enough oxygen. During this test, a belt is placed around your belly. The baby is made to move, and its heart rate is monitored during movement.  What is false labor? False labor is a condition in which you feel small, irregular tightenings of the muscles in the womb (contractions) that usually go away with rest, changing position, or drinking water. These are called Braxton Hicks contractions. Contractions may last for hours, days, or even weeks before true labor sets in. If contractions come at regular intervals, become more frequent, increase in intensity, or become painful, you should see your health care provider. What are the signs of labor?  Abdominal cramps.  Regular contractions that start at 10 minutes apart and become stronger and more frequent with time.  Contractions that start on the top of the uterus and spread down to the lower abdomen and back.  Increased pelvic pressure and dull back pain.  A watery or bloody mucus discharge that comes from the vagina.  Leaking of amniotic fluid. This is also known as your "water breaking." It could be a slow trickle or a gush. Let your health care provider know if it has a color or strange odor. If you have any of these signs, call your health care provider right away, even if it is before your due date. Follow these instructions at home: Medicines  Follow your health care provider's instructions regarding medicine use. Specific medicines may be either safe or unsafe to take during pregnancy.  Take a prenatal vitamin that contains at least 600 micrograms (mcg) of folic acid.  If you develop constipation, try taking a stool softener if your health care provider approves. Eating and drinking  Eat a balanced diet that includes fresh fruits and vegetables, whole grains, good sources of protein  such as meat, eggs, or tofu, and low-fat dairy. Your health care provider will help you determine the amount of weight gain that is right for you.  Avoid raw meat and uncooked cheese. These carry germs that can cause birth defects in the baby.  If you have low calcium intake from food, talk to your health care provider about whether you should take a daily calcium supplement.  Eat four or five small meals rather than three large meals a day.  Limit foods that are high in fat and processed sugars, such as fried and sweet foods.  To prevent constipation: ? Drink enough fluid to keep your urine clear or pale yellow. ? Eat foods that are high in fiber, such as fresh fruits and vegetables, whole grains, and beans. Activity  Exercise only as directed by your health care provider. Most women can continue their usual exercise routine during pregnancy. Try to exercise for 30 minutes at least 5 days a week. Stop exercising if you experience uterine contractions.  Avoid heavy   lifting.  Do not exercise in extreme heat or humidity, or at high altitudes.  Wear low-heel, comfortable shoes.  Practice good posture.  You may continue to have sex unless your health care provider tells you otherwise. Relieving pain and discomfort  Take frequent breaks and rest with your legs elevated if you have leg cramps or low back pain.  Take warm sitz baths to soothe any pain or discomfort caused by hemorrhoids. Use hemorrhoid cream if your health care provider approves.  Wear a good support bra to prevent discomfort from breast tenderness.  If you develop varicose veins: ? Wear support pantyhose or compression stockings as told by your healthcare provider. ? Elevate your feet for 15 minutes, 3-4 times a day. Prenatal care  Write down your questions. Take them to your prenatal visits.  Keep all your prenatal visits as told by your health care provider. This is important. Safety  Wear your seat belt at  all times when driving.  Make a list of emergency phone numbers, including numbers for family, friends, the hospital, and police and fire departments. General instructions  Avoid cat litter boxes and soil used by cats. These carry germs that can cause birth defects in the baby. If you have a cat, ask someone to clean the litter box for you.  Do not travel far distances unless it is absolutely necessary and only with the approval of your health care provider.  Do not use hot tubs, steam rooms, or saunas.  Do not drink alcohol.  Do not use any products that contain nicotine or tobacco, such as cigarettes and e-cigarettes. If you need help quitting, ask your health care provider.  Do not use any medicinal herbs or unprescribed drugs. These chemicals affect the formation and growth of the baby.  Do not douche or use tampons or scented sanitary pads.  Do not cross your legs for long periods of time.  To prepare for the arrival of your baby: ? Take prenatal classes to understand, practice, and ask questions about labor and delivery. ? Make a trial run to the hospital. ? Visit the hospital and tour the maternity area. ? Arrange for maternity or paternity leave through employers. ? Arrange for family and friends to take care of pets while you are in the hospital. ? Purchase a rear-facing car seat and make sure you know how to install it in your car. ? Pack your hospital bag. ? Prepare the baby's nursery. Make sure to remove all pillows and stuffed animals from the baby's crib to prevent suffocation.  Visit your dentist if you have not gone during your pregnancy. Use a soft toothbrush to brush your teeth and be gentle when you floss. Contact a health care provider if:  You are unsure if you are in labor or if your water has broken.  You become dizzy.  You have mild pelvic cramps, pelvic pressure, or nagging pain in your abdominal area.  You have lower back pain.  You have persistent  nausea, vomiting, or diarrhea.  You have an unusual or bad smelling vaginal discharge.  You have pain when you urinate. Get help right away if:  Your water breaks before 37 weeks.  You have regular contractions less than 5 minutes apart before 37 weeks.  You have a fever.  You are leaking fluid from your vagina.  You have spotting or bleeding from your vagina.  You have severe abdominal pain or cramping.  You have rapid weight loss or weight gain.    You have shortness of breath with chest pain.  You notice sudden or extreme swelling of your face, hands, ankles, feet, or legs.  Your baby makes fewer than 10 movements in 2 hours.  You have severe headaches that do not go away when you take medicine.  You have vision changes. Summary  The third trimester is from week 28 through week 40, months 7 through 9. The third trimester is a time when the unborn baby (fetus) is growing rapidly.  During the third trimester, your discomfort may increase as you and your baby continue to gain weight. You may have abdominal, leg, and back pain, sleeping problems, and an increased need to urinate.  During the third trimester your breasts will keep growing and they will continue to become tender. A yellow fluid (colostrum) may leak from your breasts. This is the first milk you are producing for your baby.  False labor is a condition in which you feel small, irregular tightenings of the muscles in the womb (contractions) that eventually go away. These are called Braxton Hicks contractions. Contractions may last for hours, days, or even weeks before true labor sets in.  Signs of labor can include: abdominal cramps; regular contractions that start at 10 minutes apart and become stronger and more frequent with time; watery or bloody mucus discharge that comes from the vagina; increased pelvic pressure and dull back pain; and leaking of amniotic fluid. This information is not intended to replace advice  given to you by your health care provider. Make sure you discuss any questions you have with your health care provider. Document Released: 03/31/2001 Document Revised: 09/12/2015 Document Reviewed: 06/07/2012 Elsevier Interactive Patient Education  2017 [employer]  

## 2016-09-04 LAB — PRENATAL PROFILE I(LABCORP)
Antibody Screen: NEGATIVE
Basophils Absolute: 0 10*3/uL (ref 0.0–0.2)
Basos: 0 %
EOS (ABSOLUTE): 0.1 10*3/uL (ref 0.0–0.4)
EOS: 1 %
HEMATOCRIT: 29.9 % — AB (ref 34.0–46.6)
HEMOGLOBIN: 9.8 g/dL — AB (ref 11.1–15.9)
Hepatitis B Surface Ag: NEGATIVE
Immature Grans (Abs): 0.1 10*3/uL (ref 0.0–0.1)
Immature Granulocytes: 1 %
Lymphocytes Absolute: 2.1 10*3/uL (ref 0.7–3.1)
Lymphs: 19 %
MCH: 29.4 pg (ref 26.6–33.0)
MCHC: 32.8 g/dL (ref 31.5–35.7)
MCV: 90 fL (ref 79–97)
MONOCYTES: 8 %
Monocytes Absolute: 0.9 10*3/uL (ref 0.1–0.9)
NEUTROS ABS: 7.8 10*3/uL — AB (ref 1.4–7.0)
Neutrophils: 71 %
Platelets: 342 10*3/uL (ref 150–379)
RBC: 3.33 x10E6/uL — AB (ref 3.77–5.28)
RDW: 13.3 % (ref 12.3–15.4)
RPR Ser Ql: NONREACTIVE
Rh Factor: NEGATIVE
Rubella Antibodies, IGG: 1.31 index (ref >0.99)
WBC: 11 10*3/uL — ABNORMAL HIGH (ref 3.4–10.8)

## 2016-09-04 LAB — HEMOGLOBINOPATHY EVALUATION
FERRITIN: 54 ng/mL (ref 15–150)
HGB A: 97.6 % (ref 96.4–98.8)
HGB F QUANT: 0 % (ref 0.0–2.0)
HGB SOLUBILITY: NEGATIVE
Hgb A2 Quant: 2.4 % (ref 1.8–3.2)
Hgb C: 0 %
Hgb S: 0 %
Hgb Variant: 0 %

## 2016-09-04 LAB — CYTOLOGY - PAP
ADEQUACY: ABSENT
Chlamydia: NEGATIVE
Diagnosis: NEGATIVE
Neisseria Gonorrhea: NEGATIVE

## 2016-09-04 LAB — GLUCOSE TOLERANCE, 2 HOURS W/ 1HR
GLUCOSE, 1 HOUR: 186 mg/dL — AB (ref 65–179)
GLUCOSE, 2 HOUR: 156 mg/dL — AB (ref 65–152)
Glucose, Fasting: 92 mg/dL — ABNORMAL HIGH (ref 65–91)

## 2016-09-05 LAB — URINE CULTURE, OB REFLEX

## 2016-09-05 LAB — CULTURE, OB URINE

## 2016-09-17 ENCOUNTER — Ambulatory Visit (INDEPENDENT_AMBULATORY_CARE_PROVIDER_SITE_OTHER): Payer: Medicaid Other | Admitting: Obstetrics and Gynecology

## 2016-09-17 DIAGNOSIS — O24419 Gestational diabetes mellitus in pregnancy, unspecified control: Secondary | ICD-10-CM | POA: Insufficient documentation

## 2016-09-17 DIAGNOSIS — O2303 Infections of kidney in pregnancy, third trimester: Secondary | ICD-10-CM | POA: Diagnosis not present

## 2016-09-17 DIAGNOSIS — Z3400 Encounter for supervision of normal first pregnancy, unspecified trimester: Secondary | ICD-10-CM

## 2016-09-17 DIAGNOSIS — O2302 Infections of kidney in pregnancy, second trimester: Secondary | ICD-10-CM

## 2016-09-17 LAB — OB RESULTS CONSOLE GBS: STREP GROUP B AG: POSITIVE

## 2016-09-17 MED ORDER — CEPHALEXIN 500 MG PO CAPS: ORAL_CAPSULE | ORAL | 0 refills | Status: DC

## 2016-09-17 NOTE — Progress Notes (Addendum)
   PRENATAL VISIT NOTE  Subjective:  Debbie Mccullough is a 21 y.o. G1P0 at 3526w1d being seen today for ongoing prenatal care.  She is currently monitored for the following issues for this low-risk pregnancy and has Pyelonephritis affecting pregnancy in second trimester; GBS (group b Streptococcus) UTI complicating pregnancy, second trimester; Supervision of normal first pregnancy, antepartum; Smoker; and Gestational diabetes mellitus (GDM) affecting first pregnancy on her problem list.  Patient reports no complaints.  Contractions: Not present. Vag. Bleeding: None.  Movement: Present. Denies leaking of fluid.   The following portions of the patient's history were reviewed and updated as appropriate: allergies, current medications, past family history, past medical history, past social history, past surgical history and problem list. Problem list updated. Reviewed PN lab and US results, EDD with patient.   Objective:   Vitals:   09/17/16 0748  BP: 122/70  Pulse: 77  Weight: 84.5 kg (186 lb 3.2 oz)    Fetal Status: Fetal Heart Rate (bpm): 145   Movement: Present     General:  Alert, oriented and cooperative. Patient is in no acute distress.  Skin: Skin is warm and dry. No rash noted.   Cardiovascular: Normal heart rate noted  Respiratory: Normal respiratory effort, no problems with respiration noted  Abdomen: Soft, gravid, appropriate for gestational age. Pain/Pressure: Present     Pelvic:  Cervical exam deferred        Extremities: Normal range of motion.  Edema: Trace  Mental Status: Normal mood and affect. Normal behavior. Normal judgment and thought content.   Assessment and Plan:  Pregnancy: G1P0 at 5926w1d  1. Supervision of normal first pregnancy, antepartum Desired - CBC - RPR - HIV antibody Borderline anemia but now on PNV with iron> will add iron supplement if still anemic  2. Pyelonephritis affecting pregnancy in second trimester Started on suppressive therapy with  Keflex 500mg  po qhs  3. Gestational diabetes mellitus (GDM) affecting first pregnancy Borderline elevations on OGTT> see DM educator and check CBGs. Advised cut out simple sugars  Preterm labor symptoms and general obstetric precautions including but not limited to vaginal bleeding, contractions, leaking of fluid and fetal movement were reviewed in detail with the patient. Please refer to After Visit Summary for other counseling recommendations.  Return in about 1 week (around 09/24/2016) for PN visit. See daibetes educator asap and PNV after CBG checking   Caren Griffinseirdre Brittanee Ghazarian, CNM

## 2016-09-17 NOTE — Patient Instructions (Signed)
Gestational Diabetes Mellitus, Diagnosis Gestational diabetes (gestational diabetes mellitus) is a temporary form of diabetes that some women develop during pregnancy. It usually occurs around weeks 24-28 of pregnancy and goes away after delivery. Hormonal changes during pregnancy can interfere with insulin production and function, which may result in one or both of these problems:  The pancreas does not make enough of a hormone called insulin.  Cells in the body do not respond properly to insulin that the body makes (insulin resistance).  Normally, insulin allows sugars (glucose) to enter cells in the body. The cells use glucose for energy. Insulin resistance or lack of insulin causes excess glucose to build up in the blood instead of going into cells. As a result, high blood glucose (hyperglycemia) develops. What are the risks? If gestational diabetes is treated, it is unlikely to cause problems. If it is not controlled with treatment, it may cause problems during labor and delivery, and some of those problems can be harmful to the unborn baby (fetus) and the mother. Uncontrolled gestational diabetes may also cause the newborn baby to have breathing problems and low blood glucose. Women who get gestational diabetes are more likely to develop it if they get pregnant again, and they are more likely to develop type 2 diabetes in the future. What increases the risk? This condition may be more likely to develop in pregnant women who:  Are older than age 25 during pregnancy.  Have a family history of diabetes.  Are overweight.  Had gestational diabetes in the past.  Have polycystic ovarian syndrome (PCOS).  Are pregnant with twins or multiples.  Are of American-Indian, African-American, Hispanic/Latino, or Asian/Pacific Islander descent.  What are the signs or symptoms? Most women do not notice symptoms of gestational diabetes because the symptoms are similar to normal symptoms of pregnancy.  Symptoms of gestational diabetes may include:  Increased thirst (polydipsia).  Increased hunger(polyphagia).  Increased urination (polyuria).  How is this diagnosed?  This condition may be diagnosed based on your blood glucose level, which may be checked with one or more of the following blood tests:  A fasting blood glucose (FBG) test. You will not be allowed to eat (you will fast) for at least 8 hours before a blood sample is taken.  A random blood glucose test. This checks your blood glucose at any time of day regardless of when you ate.  An oral glucose tolerance test (OGTT). This is usually done during weeks 24-28 of pregnancy. ? For this test, you will have an FBG test done. Then, you will drink a beverage that contains glucose. Your blood glucose will be tested again 1 hour after drinking the glucose beverage (1-hour OGTT). ? If the 1-hour OGTT result is at or above 140 mg/dL (7.8 mmol/L), you will repeat the OGTT. This time, your blood glucose will be tested 3 hours after drinking the glucose beverage (3-hour OGTT).  If you have risk factors, you may be screened for undiagnosed type 2 diabetes at your first health care visit during your pregnancy (prenatal visit). How is this treated?  Your treatment may be managed by a specialist called an endocrinologist. This condition is treated by following instructions from your health care provider about:  Eating a healthier diet and getting more physical activity. These changes are the most important ways to manage gestational diabetes.  Checking your blood glucose. Do this as often as told.  Taking diabetes medicines or insulin every day. These will only be prescribed if they are   needed. ? If you use insulin, you may need to adjust your dosage based on how physically active you are and what foods you eat. Your health care provider will tell you how to do this.  Your health care provider will set treatment goals for you based on the  stage of your pregnancy and any other medical conditions you have. Generally, the goal of treatment is to maintain the following blood glucose levels during pregnancy:  Fasting: at or below 95 mg/dL (5.3 mmol/L).  After meals (postprandial): ? One hour after a meal: at or below 140 mg/dL (7.8 mmol/L). ? Two hours after a meal: at or below 120 mg/dL (6.7 mmol/L).  A1c (hemoglobin A1c) level: 6-6.5%.  Follow these instructions at home:  Take over-the-counter and prescription medicines only as told by your health care provider.  Manage your weight gain during pregnancy. The amount of weight that you are expected to gain depends on your pre-pregnancy BMI (body mass index).  Keep all follow-up visits as told by your health care provider. This is important. Consider asking your health care provider these questions:   Do I need to meet with a diabetes educator?  Where can I find a support group for people with diabetes?  What equipment will I need to manage my diabetes at home?  What diabetes medicines do I need, and when should I take them?  How often do I need to check my blood glucose?  What number can I call if I have questions?  When is my next appointment? Where to find more information:  For more information about diabetes, visit: ? American Diabetes Association (ADA): www.diabetes.org ? American Association of Diabetes Educators (AADE): www.diabeteseducator.org/patient-resources Contact a health care provider if:  Your blood glucose level is at or above 240 mg/dL (13.3 mmol/L).  Your blood glucose level is at or above 200 mg/dL (11.1 mmol/L) and you have ketones in your urine.  You have been sick or have had a fever for 2 days or more and you are not getting better.  You have any of the following problems for more than 6 hours: ? You cannot eat or drink. ? You have nausea and vomiting. ? You have diarrhea. Get help right away if:  Your blood glucose is below 54  mg/dL (3 mmol/L).  You become confused or you have trouble thinking clearly.  You have difficulty breathing.  You have moderate or large ketone levels in your urine.  Your baby is moving around less than usual.  You develop unusual discharge or bleeding from your vagina.  You start having contractions early (prematurely). Contractions may feel like a tightening in your lower abdomen. This information is not intended to replace advice given to you by your health care provider. Make sure you discuss any questions you have with your health care provider. Document Released: 07/13/2000 Document Revised: 09/12/2015 Document Reviewed: 05/10/2015 Elsevier Interactive Patient Education  2017 [employer] Inc.  

## 2016-09-18 LAB — CBC
HEMATOCRIT: 34 % (ref 34.0–46.6)
HEMOGLOBIN: 11.2 g/dL (ref 11.1–15.9)
MCH: 29.3 pg (ref 26.6–33.0)
MCHC: 32.9 g/dL (ref 31.5–35.7)
MCV: 89 fL (ref 79–97)
Platelets: 243 10*3/uL (ref 150–379)
RBC: 3.82 x10E6/uL (ref 3.77–5.28)
RDW: 13.3 % (ref 12.3–15.4)
WBC: 11.9 10*3/uL — ABNORMAL HIGH (ref 3.4–10.8)

## 2016-09-18 LAB — HIV ANTIBODY (ROUTINE TESTING W REFLEX): HIV Screen 4th Generation wRfx: NONREACTIVE

## 2016-09-18 LAB — RPR: RPR: NONREACTIVE

## 2016-09-24 ENCOUNTER — Ambulatory Visit (INDEPENDENT_AMBULATORY_CARE_PROVIDER_SITE_OTHER): Payer: Medicaid Other | Admitting: Family Medicine

## 2016-09-24 VITALS — BP 118/68 | HR 92 | Wt 188.0 lb

## 2016-09-24 DIAGNOSIS — O2302 Infections of kidney in pregnancy, second trimester: Secondary | ICD-10-CM

## 2016-09-24 DIAGNOSIS — O24419 Gestational diabetes mellitus in pregnancy, unspecified control: Secondary | ICD-10-CM

## 2016-09-24 DIAGNOSIS — O2342 Unspecified infection of urinary tract in pregnancy, second trimester: Secondary | ICD-10-CM

## 2016-09-24 DIAGNOSIS — B951 Streptococcus, group B, as the cause of diseases classified elsewhere: Secondary | ICD-10-CM

## 2016-09-24 DIAGNOSIS — Z3400 Encounter for supervision of normal first pregnancy, unspecified trimester: Secondary | ICD-10-CM

## 2016-09-24 MED ORDER — GLUCOSE BLOOD VI STRP: ORAL_STRIP | 12 refills | Status: DC

## 2016-09-24 MED ORDER — ACCU-CHEK GUIDE W/DEVICE KIT: 1.0000 | PACK | Freq: Once | 0 refills | Status: AC

## 2016-09-24 MED ORDER — ACCU-CHEK FASTCLIX LANCETS MISC: 1.0000 [IU] | Freq: Four times a day (QID) | 12 refills | Status: DC

## 2016-09-24 MED ORDER — ACCU-CHEK NANO SMARTVIEW W/DEVICE KIT: 1.0000 | PACK | 0 refills | Status: DC

## 2016-09-24 NOTE — Progress Notes (Signed)
   PRENATAL VISIT NOTE  Subjective:  Debbie Mccullough is a 21 y.o. G1P0 at 1483w1d being seen today for ongoing prenatal care.  She is currently monitored for the following issues for this high-risk pregnancy and has Pyelonephritis affecting pregnancy in second trimester; GBS (group b Streptococcus) UTI complicating pregnancy, second trimester; Supervision of normal first pregnancy, antepartum; Smoker; and Gestational diabetes mellitus (GDM) affecting first pregnancy on her problem list.  Patient reports no complaints.  Contractions: Not present. Vag. Bleeding: None.  Movement: Present. Denies leaking of fluid.   The following portions of the patient's history were reviewed and updated as appropriate: allergies, current medications, past family history, past medical history, past social history, past surgical history and problem list. Problem list updated.  Objective:   Vitals:   09/24/16 1304  BP: 118/68  Pulse: 92  Weight: 188 lb (85.3 kg)    Fetal Status: Fetal Heart Rate (bpm): 154   Movement: Present     General:  Alert, oriented and cooperative. Patient is in no acute distress.  Skin: Skin is warm and dry. No rash noted.   Cardiovascular: Normal heart rate noted  Respiratory: Normal respiratory effort, no problems with respiration noted  Abdomen: Soft, gravid, appropriate for gestational age. Pain/Pressure: Present     Pelvic:  Cervical exam deferred        Extremities: Normal range of motion.  Edema: Trace  Mental Status: Normal mood and affect. Normal behavior. Normal judgment and thought content.   Assessment and Plan:  Pregnancy: G1P0 at 3683w1d  1. Supervision of normal first pregnancy, antepartum FHT and FH - Referral to Nutrition and Diabetes Services  2. GBS (group b Streptococcus) UTI complicating pregnancy, second trimester Intrapartum prophylaxis  3. Gestational diabetes mellitus (GDM) affecting first pregnancy Meter, strips, lancets prescribed.  Discussed  testing, goals, potential problems with diabetes, such as macrosomia, lacerations, increased cesarean risk, increased shoulder dystocia and Brachial plexus palsy.  - Referral to Nutrition and Diabetes Services    Preterm labor symptoms and general obstetric precautions including but not limited to vaginal bleeding, contractions, leaking of fluid and fetal movement were reviewed in detail with the patient. Please refer to After Visit Summary for other counseling recommendations.  Return in about 1 week (around 10/01/2016) for HR OB f/u, diabetes follow up.   Levie HeritageJacob J Stinson, DO

## 2016-09-24 NOTE — Patient Instructions (Addendum)
AREA PEDIATRIC/FAMILY PRACTICE PHYSICIANS  [facility] [address], [address] [place], [place]  [address] Phone - [phone]   Fax - [phone]  [facility] [address] [place], [place] [address] Phone - [phone]   Fax - [phone]  JACK AMOS [address] [place], [place]  [address] Phone - [phone]   Fax - [phone]  [facility] [address] [place], [place]  [address] Phone - [phone]   Fax - [phone]  [facility] [address] [place], [place]  [address] Phone - [phone]   Fax - [phone]  [facility] [address], [address] [place], [place]  [address] Phone - [phone]   Fax - [phone]  [facility] [address], [address] [place], [place]  [address] Phone - [phone]   Fax - [phone]  [facility] [address] [place], [place]  [address] Phone - [phone]   Fax - [phone]  [facility] [address] [place], [place]  [address] Phone - [phone]   Fax - [phone] [facility] [address] [place], [place]  [address] Phone - [phone]   Fax - [phone]  [facility] [address] [place], [place]  [address] Phone - [phone]   Fax - [phone]  [facility] [address], [address] [place], [place]  [address] Phone - [phone]   Fax - [phone]  [facility] [address] [place], [place]  [address] Phone - [phone]   Fax - [phone]  [facility] [address], [address] [place], [place]  [address] Phone - [phone]  [facility] [address] [place], [place]  [address] Phone - [phone]   Fax - [phone]  [facility] [address], [address] [place], [place]  [address] Phone - [phone]   Fax - [phone]  [facility] [address] [place], [place]  [address] Phone - [phone]   Fax - [phone]  [facility] [address] [place], [place]  [address] Phone - [phone]   Fax - [phone]   [facility] [address], [address] [place], [place]  [address] Phone - [phone]   Fax - [phone]  [facility] [address], [address] [place], [place]  [address] Phone - [phone]   Fax - [phone]  DAVID RUBIN [address], [address] [place], [place]  [address] Phone - [phone]   Fax - [phone]  [facility] [address] [place], [place]  [address] Phone - [phone]   Fax - [phone]  [facility] [address] [place], [place]  [address] Phone - [phone]   Fax - [phone] [facility] [place] [address] [place], [place]  [address] Phone - [phone]   Fax - [phone]  [facility] [address] [place], [place]  [address] Phone - [phone]   Fax - [phone]  [facility] [place] [address] [place], [place]  [address] Phone - [phone]   Fax - [phone]  [facility] Wyvonne Lenzharlene Flemming MD [address] [place] [address] Phone [phone]  Fax [phone]  Medroxyprogesterone injection [Contraceptive] What is this medicine? MEDROXYPROGESTERONE (me DROX ee proe JES te rone)  contraceptive injections prevent pregnancy. They provide effective birth control for 3 months. Depo-subQ Provera 104 is also used for treating pain related to endometriosis. This medicine may be used for other purposes; ask your health care provider or pharmacist if you have questions. COMMON BRAND NAME(S): Depo-Provera, Depo-subQ Provera 104 What should I tell my health  care provider before I take this medicine? They need to know if you have any of these conditions: -frequently drink alcohol -asthma -blood vessel disease or a history of a blood clot in the lungs or legs -bone disease such as osteoporosis -breast cancer -diabetes -eating disorder (anorexia nervosa or bulimia) -high blood pressure -HIV infection or AIDS -kidney disease -liver disease -mental depression -migraine -seizures (convulsions) -stroke -tobacco smoker -vaginal bleeding -an unusual or allergic reaction to medroxyprogesterone, other hormones, medicines, foods, dyes, or preservatives -pregnant or trying to get pregnant -breast-feeding How should I use this medicine? Depo-Provera Contraceptive injection is given into a muscle. Depo-subQ Provera 104 injection is given under the skin. These injections are given by a health care professional. You must not be pregnant before getting an injection. The injection is usually given during the first 5 days after the start of a menstrual period or 6 weeks after delivery of a baby. Talk to your pediatrician regarding the use of this medicine in children. Special care may be needed. These injections have been used in female children who have started having menstrual periods. Overdosage: If you think you have taken too much of this medicine contact a poison control center or emergency room at once. NOTE: This medicine is only for you. Do not share this medicine with others. What if I miss a dose? Try not to miss a dose. You must get an injection once every 3 months to maintain birth control. If you cannot keep an appointment, call and reschedule it. If you wait longer than 13 weeks between Depo-Provera contraceptive injections or longer than 14 weeks between Depo-subQ Provera 104 injections, you could get pregnant. Use another method for birth control if you miss your appointment. You may also need a pregnancy test before receiving another  injection. What may interact with this medicine? Do not take this medicine with any of the following medications: -bosentan This medicine may also interact with the following medications: -aminoglutethimide -antibiotics or medicines for infections, especially rifampin, rifabutin, rifapentine, and griseofulvin -aprepitant -barbiturate medicines such as phenobarbital or primidone -bexarotene -carbamazepine -medicines for seizures like ethotoin, felbamate, oxcarbazepine, phenytoin, topiramate -modafinil -St. John's wort This list may not describe all possible interactions. Give your health care provider a list of all the medicines, herbs, non-prescription drugs, or dietary supplements you use. Also tell them if you smoke, drink alcohol, or use illegal drugs. Some items may interact with your medicine. What should I watch for while using this medicine? This drug does not protect you against HIV infection (AIDS) or other sexually transmitted diseases. Use of this product may cause you to lose calcium from your bones. Loss of calcium may cause weak bones (osteoporosis). Only use this product for more than 2 years if other forms of birth control are not right for you. The longer you use this product for birth control the more likely you will be at risk for weak bones. Ask your health care professional how you can keep strong bones. You may have a change in bleeding pattern or irregular periods. Many females stop having periods while taking this drug. If you have received your injections on time, your chance  of being pregnant is very low. If you think you may be pregnant, see your health care professional as soon as possible. Tell your health care professional if you want to get pregnant within the next year. The effect of this medicine may last a long time after you get your last injection. What side effects may I notice from receiving this medicine? Side effects that you should report to your doctor  or health care professional as soon as possible: -allergic reactions like skin rash, itching or hives, swelling of the face, lips, or tongue -breast tenderness or discharge -breathing problems -changes in vision -depression -feeling faint or lightheaded, falls -fever -pain in the abdomen, chest, groin, or leg -problems with balance, talking, walking -unusually weak or tired -yellowing of the eyes or skin Side effects that usually do not require medical attention (report to your doctor or health care professional if they continue or are bothersome): -acne -fluid retention and swelling -headache -irregular periods, spotting, or absent periods -temporary pain, itching, or skin reaction at site where injected -weight gain This list may not describe all possible side effects. Call your doctor for medical advice about side effects. You may report side effects to FDA at 1-800-FDA-1088. Where should I keep my medicine? This does not apply. The injection will be given to you by a health care professional. NOTE: This sheet is a summary. It may not cover all possible information. If you have questions about this medicine, talk to your doctor, pharmacist, or health care provider.  2018 Elsevier/Gold Standard (2008-04-27 18:37:56)

## 2016-09-29 ENCOUNTER — Encounter: Payer: Self-pay | Admitting: Skilled Nursing Facility1

## 2016-09-29 ENCOUNTER — Encounter: Payer: Medicaid Other | Attending: Obstetrics & Gynecology | Admitting: Skilled Nursing Facility1

## 2016-09-29 DIAGNOSIS — Z3A00 Weeks of gestation of pregnancy not specified: Secondary | ICD-10-CM | POA: Insufficient documentation

## 2016-09-29 DIAGNOSIS — O24419 Gestational diabetes mellitus in pregnancy, unspecified control: Secondary | ICD-10-CM | POA: Diagnosis not present

## 2016-09-29 DIAGNOSIS — Z713 Dietary counseling and surveillance: Secondary | ICD-10-CM | POA: Diagnosis not present

## 2016-09-29 NOTE — Progress Notes (Signed)
Diabetes Self-Management Education  Visit Type: First/Initial  Appt. Start Time: 10:20Appt. End Time: 12:15  09/29/2016  Debbie Mccullough, identified by name and date of birth, is a 21 y.o. female with a diagnosis of Diabetes: Gestational Diabetes.   ASSESSMENT  Height _0  (1.6 m), weight 192 lb 1.6 oz (87.1 kg), last menstrual period 02/19/2016. Body mass index is 34.03 kg/m.  Pt states she is taking a antibiotic. Pt states she does not have much of an appetite. Pt states she does not sleep at night due to pregnant belly being in the way so napping throughout the day. Pt states she lives with her sister.  Pts blood sugar was fasting 92.     Diabetes Self-Management Education - 09/29/16 1031      Visit Information   Visit Type First/Initial     Initial Visit   Diabetes Type Gestational Diabetes   Are you currently following a meal plan? No   Are you taking your medications as prescribed? Not on Medications     Health Coping   How would you rate your overall health? Good     Psychosocial Assessment   Patient Belief/Attitude about Diabetes Afraid     Pre-Education Assessment   Patient understands the diabetes disease and treatment process. Needs Instruction   Patient understands incorporating nutritional management into lifestyle. Needs Instruction   Patient undertands incorporating physical activity into lifestyle. Needs Instruction   Patient understands using medications safely. Needs Instruction   Patient understands monitoring blood glucose, interpreting and using results Needs Instruction   Patient understands prevention, detection, and treatment of acute complications. Needs Instruction   Patient understands prevention, detection, and treatment of chronic complications. Needs Instruction   Patient understands how to develop strategies to address psychosocial issues. Needs Instruction   Patient understands how to develop strategies to promote health/change  behavior. Needs Instruction     Complications   How often do you check your blood sugar? 0 times/day (not testing)   Have you had a dilated eye exam in the past 12 months? No   Have you had a dental exam in the past 12 months? No   Are you checking your feet? No     Dietary Intake   Breakfast 2 eggs fried in butter and 3 bacon   Lunch ham sandwich with mustard    Snack (afternoon) potato chips    Dinner baked chicken with seasoning salt with mac n cheese and greenbeans    Beverage(s) water     Exercise   Exercise Type Light (walking / raking leaves)   How many days per week to you exercise? 5   How many minutes per day do you exercise? 20   Total minutes per week of exercise 100     Patient Education   Previous Diabetes Education No   Disease state  Factors that contribute to the development of diabetes;Explored patient's options for treatment of their diabetes   Nutrition management  Role of diet in the treatment of diabetes and the relationship between the three main macronutrients and blood glucose level;Food label reading, portion sizes and measuring food.;Carbohydrate counting;Reviewed blood glucose goals for pre and post meals and how to evaluate the patients' food intake on their blood glucose level.;Meal timing in regards to the patients' current diabetes medication.;Information on hints to eating out and maintain blood glucose control.   Physical activity and exercise  Role of exercise on diabetes management, blood pressure control and cardiac health.;Identified with patient nutritional  and/or medication changes necessary with exercise.;Helped patient identify appropriate exercises in relation to his/her diabetes, diabetes complications and other health issue.   Monitoring Taught/evaluated SMBG meter.;Purpose and frequency of SMBG.;Identified appropriate SMBG and/or A1C goals.   Psychosocial adjustment Worked with patient to identify barriers to care and solutions;Role of stress  on diabetes   Preconception care Reviewed with patient blood glucose goals with pregnancy     Individualized Goals (developed by patient)   Nutrition Follow meal plan discussed;Adjust meds/carbs with exercise as discussed;General guidelines for healthy choices and portions discussed   Physical Activity Exercise 5-7 days per week;30 minutes per day   Medications Not Applicable   Monitoring  test my blood glucose as discussed;test blood glucose pre and post meals as discussed     Post-Education Assessment   Patient understands the diabetes disease and treatment process. Demonstrates understanding / competency   Patient understands incorporating nutritional management into lifestyle. Demonstrates understanding / competency   Patient undertands incorporating physical activity into lifestyle. Demonstrates understanding / competency   Patient understands using medications safely. Demonstrates understanding / competency   Patient understands monitoring blood glucose, interpreting and using results Demonstrates understanding / competency   Patient understands prevention, detection, and treatment of acute complications. Demonstrates understanding / competency   Patient understands prevention, detection, and treatment of chronic complications. Demonstrates understanding / competency   Patient understands how to develop strategies to address psychosocial issues. Demonstrates understanding / competency   Patient understands how to develop strategies to promote health/change behavior. Demonstrates understanding / competency     Outcomes   Expected Outcomes Demonstrated interest in learning. Expect positive outcomes   Future DMSE PRN   Program Status Completed      Individualized Plan for Diabetes Self-Management Training:   Learning Objective:  Patient will have a greater understanding of diabetes self-management. Patient education plan is to attend individual and/or group sessions per assessed  needs and concerns.   Plan:   There are no Patient Instructions on file for this visit.  Expected Outcomes:  Demonstrated interest in learning. Expect positive outcomes  Education material provided: Gestational Packet   If problems or questions, patient to contact team via:  Phone  Future DSME appointment: PRN

## 2016-10-08 ENCOUNTER — Ambulatory Visit (INDEPENDENT_AMBULATORY_CARE_PROVIDER_SITE_OTHER): Payer: Medicaid Other | Admitting: Obstetrics and Gynecology

## 2016-10-08 VITALS — BP 131/65 | HR 98 | Wt 186.6 lb

## 2016-10-08 DIAGNOSIS — O2342 Unspecified infection of urinary tract in pregnancy, second trimester: Secondary | ICD-10-CM

## 2016-10-08 DIAGNOSIS — B951 Streptococcus, group B, as the cause of diseases classified elsewhere: Secondary | ICD-10-CM

## 2016-10-08 DIAGNOSIS — O0993 Supervision of high risk pregnancy, unspecified, third trimester: Secondary | ICD-10-CM

## 2016-10-08 DIAGNOSIS — O2343 Unspecified infection of urinary tract in pregnancy, third trimester: Secondary | ICD-10-CM

## 2016-10-08 DIAGNOSIS — O2303 Infections of kidney in pregnancy, third trimester: Secondary | ICD-10-CM

## 2016-10-08 DIAGNOSIS — O24419 Gestational diabetes mellitus in pregnancy, unspecified control: Secondary | ICD-10-CM

## 2016-10-08 DIAGNOSIS — O24410 Gestational diabetes mellitus in pregnancy, diet controlled: Secondary | ICD-10-CM

## 2016-10-08 DIAGNOSIS — O2302 Infections of kidney in pregnancy, second trimester: Secondary | ICD-10-CM

## 2016-10-08 NOTE — Addendum Note (Signed)
Addended by: [place] BingPICKENS, Vlada Uriostegui on: 10/08/2016 02:17 PM   Modules accepted: Orders

## 2016-10-08 NOTE — Addendum Note (Signed)
Addended by: Mikey BussingWILSON, CHIQUITA L on: 10/08/2016 04:32 PM   Modules accepted: Orders

## 2016-10-08 NOTE — Progress Notes (Signed)
Prenatal Visit Note Date: 10/08/2016 Clinic: [facility]  Subjective:  Debbie Mccullough is a 21 y.o. G1P0 at [redacted]w[redacted]d being seen today for ongoing prenatal care.  She is currently monitored for the following issues for this high-risk pregnancy and has Pyelonephritis affecting pregnancy in second trimester; GBS (group b Streptococcus) UTI complicating pregnancy, second trimester; Supervision of high risk pregnancy, antepartum, third trimester; Smoker; and Gestational diabetes mellitus (GDM) affecting first pregnancy on her problem list.  Patient reports no complaints.   Contractions: Irregular. Vag. Bleeding: None.  Movement: Present. Denies leaking of fluid.   The following portions of the patient's history were reviewed and updated as appropriate: allergies, current medications, past family history, past medical history, past social history, past surgical history and problem list. Problem list updated.  Objective:   Vitals:   10/08/16 1355  BP: 131/65  Pulse: 98  Weight: 186 lb 9.6 oz (84.6 kg)    Fetal Status: Fetal Heart Rate (bpm): 151 Fundal Height: 33 cm Movement: Present  Presentation: Vertex  General:  Alert, oriented and cooperative. Patient is in no acute distress.  Skin: Skin is warm and dry. No rash noted.   Cardiovascular: Normal heart rate noted  Respiratory: Normal respiratory effort, no problems with respiration noted  Abdomen: Soft, gravid, appropriate for gestational age. Pain/Pressure: Present     Pelvic:  Cervical exam deferred        Extremities: Normal range of motion.  Edema: Trace  Mental Status: Normal mood and affect. Normal behavior. Normal judgment and thought content.   Urinalysis:      Assessment and Plan:  Pregnancy: G1P0 at [redacted]w[redacted]d  1. GDM Normal BS log today. 37wk growth u/s scheduled. D/w her re: 40wk IOL - US MFM OB FOLLOW UP; Future  2. Supervision of high risk pregnancy in third trimester Routine care - US MFM OB FOLLOW  UP; Future  3. GBS (group b Streptococcus) UTI complicating pregnancy, second trimester On keflex qhs she confirms  4. Pyelonephritis affecting pregnancy in second trimester See above  Preterm labor symptoms and general obstetric precautions including but not limited to vaginal bleeding, contractions, leaking of fluid and fetal movement were reviewed in detail with the patient. Please refer to After Visit Summary for other counseling recommendations.  Return in about 10 days (around 10/18/2016) for rob.  [place] Bing.   Alanah Sakuma, MD

## 2016-10-16 ENCOUNTER — Ambulatory Visit (HOSPITAL_COMMUNITY)
Admission: RE | Admit: 2016-10-16 | Discharge: 2016-10-16 | Disposition: A | Discharge status: Home / Self Care | Payer: Medicaid Other | Source: Ambulatory Visit | Attending: Obstetrics and Gynecology | Admitting: Obstetrics and Gynecology

## 2016-10-16 ENCOUNTER — Other Ambulatory Visit: Payer: Self-pay | Admitting: Obstetrics and Gynecology

## 2016-10-16 DIAGNOSIS — O24419 Gestational diabetes mellitus in pregnancy, unspecified control: Secondary | ICD-10-CM | POA: Insufficient documentation

## 2016-10-16 DIAGNOSIS — O99213 Obesity complicating pregnancy, third trimester: Secondary | ICD-10-CM

## 2016-10-16 DIAGNOSIS — O0993 Supervision of high risk pregnancy, unspecified, third trimester: Secondary | ICD-10-CM

## 2016-10-16 DIAGNOSIS — Z720 Tobacco use: Secondary | ICD-10-CM | POA: Insufficient documentation

## 2016-10-16 DIAGNOSIS — O09893 Supervision of other high risk pregnancies, third trimester: Secondary | ICD-10-CM | POA: Insufficient documentation

## 2016-10-16 DIAGNOSIS — O99333 Smoking (tobacco) complicating pregnancy, third trimester: Secondary | ICD-10-CM | POA: Insufficient documentation

## 2016-10-16 DIAGNOSIS — E669 Obesity, unspecified: Secondary | ICD-10-CM | POA: Insufficient documentation

## 2016-10-16 DIAGNOSIS — Z362 Encounter for other antenatal screening follow-up: Secondary | ICD-10-CM

## 2016-10-16 DIAGNOSIS — Z3A34 34 weeks gestation of pregnancy: Secondary | ICD-10-CM | POA: Diagnosis not present

## 2016-10-16 DIAGNOSIS — Z3689 Encounter for other specified antenatal screening: Secondary | ICD-10-CM | POA: Diagnosis present

## 2016-10-22 ENCOUNTER — Encounter: Payer: Medicaid Other | Admitting: Family Medicine

## 2016-10-22 ENCOUNTER — Telehealth: Payer: Self-pay | Admitting: General Practice

## 2016-10-22 NOTE — Telephone Encounter (Signed)
Patient No Show for her appt today with Dr. Adrian BlackwaterStinson.  Patient has been rescheduled on 11/02/16 at 10:00am.  Left message on VM for patient to call our office if she is unable to keep this appt.

## 2016-11-02 ENCOUNTER — Other Ambulatory Visit (HOSPITAL_COMMUNITY)
Admission: RE | Admit: 2016-11-02 | Discharge: 2016-11-02 | Disposition: A | Discharge status: Home / Self Care | Payer: Medicaid Other | Source: Ambulatory Visit | Attending: Obstetrics and Gynecology | Admitting: Obstetrics and Gynecology

## 2016-11-02 ENCOUNTER — Ambulatory Visit (INDEPENDENT_AMBULATORY_CARE_PROVIDER_SITE_OTHER): Payer: Medicaid Other | Admitting: Obstetrics and Gynecology

## 2016-11-02 ENCOUNTER — Encounter: Payer: Self-pay | Admitting: Obstetrics and Gynecology

## 2016-11-02 VITALS — BP 119/79 | HR 80 | Wt 198.5 lb

## 2016-11-02 DIAGNOSIS — O2302 Infections of kidney in pregnancy, second trimester: Secondary | ICD-10-CM

## 2016-11-02 DIAGNOSIS — O2343 Unspecified infection of urinary tract in pregnancy, third trimester: Secondary | ICD-10-CM

## 2016-11-02 DIAGNOSIS — O0993 Supervision of high risk pregnancy, unspecified, third trimester: Secondary | ICD-10-CM

## 2016-11-02 DIAGNOSIS — F1210 Cannabis abuse, uncomplicated: Secondary | ICD-10-CM | POA: Diagnosis not present

## 2016-11-02 DIAGNOSIS — O2303 Infections of kidney in pregnancy, third trimester: Secondary | ICD-10-CM

## 2016-11-02 DIAGNOSIS — O24410 Gestational diabetes mellitus in pregnancy, diet controlled: Secondary | ICD-10-CM

## 2016-11-02 DIAGNOSIS — O2342 Unspecified infection of urinary tract in pregnancy, second trimester: Secondary | ICD-10-CM

## 2016-11-02 DIAGNOSIS — Z113 Encounter for screening for infections with a predominantly sexual mode of transmission: Secondary | ICD-10-CM

## 2016-11-02 DIAGNOSIS — O24419 Gestational diabetes mellitus in pregnancy, unspecified control: Secondary | ICD-10-CM

## 2016-11-02 DIAGNOSIS — B951 Streptococcus, group B, as the cause of diseases classified elsewhere: Secondary | ICD-10-CM

## 2016-11-02 LAB — OB RESULTS CONSOLE GC/CHLAMYDIA: GC PROBE AMP, GENITAL: NEGATIVE

## 2016-11-02 MED ORDER — RANITIDINE HCL 150 MG PO CAPS: 150.0000 mg | ORAL_CAPSULE | Freq: Two times a day (BID) | ORAL | 0 refills | Status: DC

## 2016-11-02 MED ORDER — DOXYLAMINE SUCCINATE (SLEEP) 25 MG PO TABS: 25.0000 mg | ORAL_TABLET | Freq: Every evening | ORAL | 2 refills | Status: DC | PRN

## 2016-11-02 MED ORDER — DIPHENHYDRAMINE HCL 25 MG PO CAPS: 25.0000 mg | ORAL_CAPSULE | Freq: Every evening | ORAL | 0 refills | Status: DC | PRN

## 2016-11-02 NOTE — Addendum Note (Signed)
Addended by: Clearnce SorrelPICKARD, JILL S on: 11/02/2016 10:51 AM   Modules accepted: Orders

## 2016-11-02 NOTE — Progress Notes (Signed)
Patient complains of being very uncomfortable at night and unable to sleep until 5am in the morning for 1 week now, also patient has been experiencing nausea and vomiting for 1 week also.

## 2016-11-02 NOTE — Progress Notes (Signed)
Prenatal Visit Note Date: 11/02/2016 Clinic: [facility]  Subjective:  Debbie Mccullough is a 21 y.o. G1P0 at 2062w5d being seen today for ongoing prenatal care.  She is currently monitored for the following issues for this high-risk pregnancy and has Pyelonephritis affecting pregnancy in second trimester; GBS (group b Streptococcus) UTI complicating pregnancy, second trimester; Supervision of high risk pregnancy, antepartum, third trimester; Smoker; and Gestational diabetes mellitus (GDM) affecting first pregnancy on her problem list.  Patient reports see RN note..   Contractions: Irregular. Vag. Bleeding: None.  Movement: Present. Denies leaking of fluid.   The following portions of the patient's history were reviewed and updated as appropriate: allergies, current medications, past family history, past medical history, past social history, past surgical history and problem list. Problem list updated.  Objective:   Vitals:   11/02/16 1009  BP: 119/79  Pulse: 80  Weight: 198 lb 8 oz (90 kg)    Fetal Status: Fetal Heart Rate (bpm): 140 Fundal Height: 37 cm Movement: Present  Presentation: Vertex  General:  Alert, oriented and cooperative. Patient is in no acute distress.  Skin: Skin is warm and dry. No rash noted.   Cardiovascular: Normal heart rate noted  Respiratory: Normal respiratory effort, no problems with respiration noted  Abdomen: Soft, gravid, appropriate for gestational age. Pain/Pressure: Present     Pelvic:  Cervical exam deferred        Extremities: Normal range of motion.  Edema: Trace  Mental Status: Normal mood and affect. Normal behavior. Normal judgment and thought content.   Urinalysis:      Assessment and Plan:  Pregnancy: G1P0 at 4862w5d  1. GDM, class A1 Didn't bring log book today or meter. Gives normal AM fasting and 2hr PP values. If doesn't bring nv, consider a1c and random BS in clinic. Late July growth u/s ordered.  - US MFM OB FOLLOW  UP; Future  2. Supervision of high risk pregnancy, antepartum, third trimester No issues. Has GERD. Recommend zantac and unisom/benadryl to help. F/u nv. Counseled on THC cessation  3. GBS (group b Streptococcus) UTI complicating pregnancy, second trimester Continue keflex ppx  4. Pyelonephritis affecting pregnancy in second trimester See above   Preterm labor symptoms and general obstetric precautions including but not limited to vaginal bleeding, contractions, leaking of fluid and fetal movement were reviewed in detail with the patient. Please refer to After Visit Summary for other counseling recommendations.  Return in about 1 week (around 11/09/2016) for rob.   [place] BingPickens, Gwenyth Dingee, MD

## 2016-11-03 LAB — GC/CHLAMYDIA PROBE AMP (~~LOC~~) NOT AT ARMC
Chlamydia: NEGATIVE
Neisseria Gonorrhea: NEGATIVE

## 2016-11-03 NOTE — Addendum Note (Signed)
Addended by: Kathee DeltonHILLMAN, Gazella Anglin L on: 11/03/2016 08:33 AM   Modules accepted: Orders

## 2016-11-09 ENCOUNTER — Inpatient Hospital Stay (HOSPITAL_COMMUNITY)
Admission: AD | Admit: 2016-11-09 | Discharge: 2016-11-13 | DRG: 774 | Disposition: A | Discharge status: Home / Self Care | Payer: Medicaid Other | Source: Ambulatory Visit | Attending: Obstetrics & Gynecology | Admitting: Obstetrics & Gynecology

## 2016-11-09 ENCOUNTER — Encounter (HOSPITAL_COMMUNITY): Payer: Self-pay | Admitting: *Deleted

## 2016-11-09 ENCOUNTER — Ambulatory Visit (INDEPENDENT_AMBULATORY_CARE_PROVIDER_SITE_OTHER): Payer: Medicaid Other | Admitting: Family Medicine

## 2016-11-09 VITALS — BP 150/87 | HR 89 | Wt 198.8 lb

## 2016-11-09 DIAGNOSIS — Z87891 Personal history of nicotine dependence: Secondary | ICD-10-CM

## 2016-11-09 DIAGNOSIS — Z6834 Body mass index (BMI) 34.0-34.9, adult: Secondary | ICD-10-CM | POA: Diagnosis not present

## 2016-11-09 DIAGNOSIS — O0993 Supervision of high risk pregnancy, unspecified, third trimester: Secondary | ICD-10-CM

## 2016-11-09 DIAGNOSIS — O169 Unspecified maternal hypertension, unspecified trimester: Secondary | ICD-10-CM

## 2016-11-09 DIAGNOSIS — O24420 Gestational diabetes mellitus in childbirth, diet controlled: Secondary | ICD-10-CM | POA: Diagnosis present

## 2016-11-09 DIAGNOSIS — F1210 Cannabis abuse, uncomplicated: Secondary | ICD-10-CM | POA: Diagnosis present

## 2016-11-09 DIAGNOSIS — O99820 Streptococcus B carrier state complicating pregnancy: Secondary | ICD-10-CM | POA: Diagnosis not present

## 2016-11-09 DIAGNOSIS — O1404 Mild to moderate pre-eclampsia, complicating childbirth: Secondary | ICD-10-CM | POA: Diagnosis present

## 2016-11-09 DIAGNOSIS — E669 Obesity, unspecified: Secondary | ICD-10-CM | POA: Diagnosis present

## 2016-11-09 DIAGNOSIS — O1494 Unspecified pre-eclampsia, complicating childbirth: Secondary | ICD-10-CM | POA: Diagnosis present

## 2016-11-09 DIAGNOSIS — O99324 Drug use complicating childbirth: Secondary | ICD-10-CM | POA: Diagnosis present

## 2016-11-09 DIAGNOSIS — O99214 Obesity complicating childbirth: Secondary | ICD-10-CM | POA: Diagnosis present

## 2016-11-09 DIAGNOSIS — O1413 Severe pre-eclampsia, third trimester: Secondary | ICD-10-CM

## 2016-11-09 DIAGNOSIS — O24410 Gestational diabetes mellitus in pregnancy, diet controlled: Secondary | ICD-10-CM

## 2016-11-09 DIAGNOSIS — Z3A37 37 weeks gestation of pregnancy: Secondary | ICD-10-CM

## 2016-11-09 DIAGNOSIS — O1493 Unspecified pre-eclampsia, third trimester: Secondary | ICD-10-CM | POA: Diagnosis present

## 2016-11-09 DIAGNOSIS — Z3A39 39 weeks gestation of pregnancy: Secondary | ICD-10-CM | POA: Diagnosis not present

## 2016-11-09 DIAGNOSIS — O99824 Streptococcus B carrier state complicating childbirth: Secondary | ICD-10-CM | POA: Diagnosis present

## 2016-11-09 DIAGNOSIS — O1414 Severe pre-eclampsia complicating childbirth: Secondary | ICD-10-CM | POA: Diagnosis not present

## 2016-11-09 HISTORY — DX: Tubulo-interstitial nephritis, not specified as acute or chronic: N12

## 2016-11-09 HISTORY — DX: Gestational diabetes mellitus in pregnancy, unspecified control: O24.419

## 2016-11-09 LAB — COMPREHENSIVE METABOLIC PANEL
ALBUMIN: 2.9 g/dL — AB (ref 3.5–5.0)
ALK PHOS: 162 U/L — AB (ref 38–126)
ALT: 10 U/L — AB (ref 14–54)
AST: 16 U/L (ref 15–41)
Anion gap: 7 (ref 5–15)
CO2: 23 mmol/L (ref 22–32)
CREATININE: 0.5 mg/dL (ref 0.44–1.00)
Calcium: 9.2 mg/dL (ref 8.9–10.3)
Chloride: 104 mmol/L (ref 101–111)
GFR calc non Af Amer: >60 mL/min (ref >60)
GLUCOSE: 115 mg/dL — AB (ref 65–99)
Potassium: 4.2 mmol/L (ref 3.5–5.1)
SODIUM: 134 mmol/L — AB (ref 135–145)
Total Bilirubin: 0.5 mg/dL (ref 0.3–1.2)
Total Protein: 6.8 g/dL (ref 6.5–8.1)

## 2016-11-09 LAB — CBC WITH DIFFERENTIAL/PLATELET
BASOS ABS: 0 10*3/uL (ref 0.0–0.1)
Basophils Relative: 0 %
EOS ABS: 0.1 10*3/uL (ref 0.0–0.7)
Eosinophils Relative: 1 %
HCT: 35.9 % — ABNORMAL LOW (ref 36.0–46.0)
HEMOGLOBIN: 12.1 g/dL (ref 12.0–15.0)
LYMPHS ABS: 2 10*3/uL (ref 0.7–4.0)
Lymphocytes Relative: 26 %
MCH: 29.7 pg (ref 26.0–34.0)
MCHC: 33.7 g/dL (ref 30.0–36.0)
MCV: 88.2 fL (ref 78.0–100.0)
Monocytes Absolute: 0.5 10*3/uL (ref 0.1–1.0)
Monocytes Relative: 6 %
NEUTROS PCT: 67 %
Neutro Abs: 5 10*3/uL (ref 1.7–7.7)
Platelets: 305 10*3/uL (ref 150–400)
RBC: 4.07 MIL/uL (ref 3.87–5.11)
RDW: 13.6 % (ref 11.5–15.5)
WBC: 7.5 10*3/uL (ref 4.0–10.5)

## 2016-11-09 LAB — POCT URINALYSIS DIP (DEVICE)
BILIRUBIN URINE: NEGATIVE
GLUCOSE, UA: NEGATIVE mg/dL
KETONES UR: NEGATIVE mg/dL
Nitrite: NEGATIVE
Protein, ur: NEGATIVE mg/dL
Specific Gravity, Urine: 1.015 (ref 1.005–1.030)
Urobilinogen, UA: 0.2 mg/dL (ref 0.0–1.0)
pH: 7 (ref 5.0–8.0)

## 2016-11-09 LAB — URINALYSIS, ROUTINE W REFLEX MICROSCOPIC
BILIRUBIN URINE: NEGATIVE
Glucose, UA: NEGATIVE mg/dL
KETONES UR: NEGATIVE mg/dL
NITRITE: NEGATIVE
Protein, ur: NEGATIVE mg/dL
Specific Gravity, Urine: 1.006 (ref 1.005–1.030)
pH: 7 (ref 5.0–8.0)

## 2016-11-09 LAB — PROTEIN / CREATININE RATIO, URINE
Creatinine, Urine: 63 mg/dL
Protein Creatinine Ratio: 0.46 mg/mg{Cre} — ABNORMAL HIGH (ref 0.00–0.15)
Total Protein, Urine: 29 mg/dL

## 2016-11-09 LAB — GLUCOSE, CAPILLARY: GLUCOSE-CAPILLARY: 99 mg/dL (ref 65–99)

## 2016-11-09 MED ORDER — DEXTROSE 5 % IV SOLN
5.0000 10*6.[IU] | Freq: Once | INTRAVENOUS | Status: AC
  Administered 2016-11-10: 5 10*6.[IU] via INTRAVENOUS
  Filled 2016-11-09: qty 5

## 2016-11-09 MED ORDER — LIDOCAINE HCL (PF) 1 % IJ SOLN
30.0000 mL | INTRAMUSCULAR | Status: AC | PRN
  Administered 2016-11-11: 30 mL via SUBCUTANEOUS
  Filled 2016-11-09: qty 30

## 2016-11-09 MED ORDER — OXYCODONE-ACETAMINOPHEN 5-325 MG PO TABS: 1.0000 | ORAL_TABLET | ORAL | Status: DC | PRN

## 2016-11-09 MED ORDER — SOD CITRATE-CITRIC ACID 500-334 MG/5ML PO SOLN
30.0000 mL | ORAL | Status: DC | PRN
  Filled 2016-11-09: qty 15

## 2016-11-09 MED ORDER — ONDANSETRON HCL 4 MG/2ML IJ SOLN
4.0000 mg | Freq: Four times a day (QID) | INTRAMUSCULAR | Status: DC | PRN
  Administered 2016-11-10: 4 mg via INTRAVENOUS
  Filled 2016-11-09: qty 2

## 2016-11-09 MED ORDER — LACTATED RINGERS IV SOLN
500.0000 mL | INTRAVENOUS | Status: DC | PRN
  Administered 2016-11-10: 1000 mL via INTRAVENOUS
  Administered 2016-11-10: 250 mL via INTRAVENOUS
  Administered 2016-11-10 – 2016-11-11 (×4): 500 mL via INTRAVENOUS

## 2016-11-09 MED ORDER — LACTATED RINGERS IV SOLN
INTRAVENOUS | Status: DC
  Administered 2016-11-09 – 2016-11-10 (×2): via INTRAVENOUS

## 2016-11-09 MED ORDER — PENICILLIN G POT IN DEXTROSE 60000 UNIT/ML IV SOLN
3.0000 10*6.[IU] | INTRAVENOUS | Status: DC
  Filled 2016-11-09 (×5): qty 50

## 2016-11-09 MED ORDER — OXYCODONE-ACETAMINOPHEN 5-325 MG PO TABS: 2.0000 | ORAL_TABLET | ORAL | Status: DC | PRN

## 2016-11-09 MED ORDER — OXYTOCIN 40 UNITS IN LACTATED RINGERS INFUSION - SIMPLE MED
2.5000 [IU]/h | INTRAVENOUS | Status: DC
  Administered 2016-11-11: 2.5 [IU]/h via INTRAVENOUS
  Filled 2016-11-09: qty 1000

## 2016-11-09 MED ORDER — MISOPROSTOL 25 MCG QUARTER TABLET
25.0000 ug | ORAL_TABLET | ORAL | Status: DC | PRN
  Administered 2016-11-09: 25 ug via VAGINAL
  Filled 2016-11-09 (×2): qty 1

## 2016-11-09 MED ORDER — PENICILLIN G POT IN DEXTROSE 60000 UNIT/ML IV SOLN
3.0000 10*6.[IU] | INTRAVENOUS | Status: DC
  Filled 2016-11-09: qty 50

## 2016-11-09 MED ORDER — PENICILLIN G POTASSIUM 5000000 UNITS IJ SOLR
5.0000 10*6.[IU] | Freq: Once | INTRAMUSCULAR | Status: DC
  Filled 2016-11-09: qty 5

## 2016-11-09 MED ORDER — ACETAMINOPHEN 325 MG PO TABS
650.0000 mg | ORAL_TABLET | ORAL | Status: DC | PRN
  Administered 2016-11-10 – 2016-11-11 (×2): 650 mg via ORAL
  Filled 2016-11-09 (×2): qty 2

## 2016-11-09 MED ORDER — TERBUTALINE SULFATE 1 MG/ML IJ SOLN
0.2500 mg | Freq: Once | INTRAMUSCULAR | Status: DC | PRN
  Filled 2016-11-09: qty 1

## 2016-11-09 MED ORDER — FLEET ENEMA 7-19 GM/118ML RE ENEM: 1.0000 | ENEMA | RECTAL | Status: DC | PRN

## 2016-11-09 MED ORDER — OXYTOCIN BOLUS FROM INFUSION
500.0000 mL | Freq: Once | INTRAVENOUS | Status: AC
  Administered 2016-11-11: 500 mL via INTRAVENOUS

## 2016-11-09 NOTE — MAU Provider Note (Signed)
I confirm that I have verified the information documented in the physician assistant's note and that I have also personally reperformed the physical exam and all medical decision making activities.  Marny LowensteinWenzel, [name] N, PA-C 11/09/2016 7:49 PM

## 2016-11-09 NOTE — Progress Notes (Signed)
Patient was sent to MAU for Pre-Eclampsia evaluation by Dr Adrian BlackwaterStinson.

## 2016-11-09 NOTE — H&P (Signed)
LABOR AND DELIVERY ADMISSION HISTORY AND PHYSICAL NOTE  Debbie Mccullough is a 21 y.o. female G1P0 with IUP at 7839w5d by LMP/27wk presenting for IOL 2/2 PreE w/o severe features with elevated pressures and P:C ratio 0.46  She reports positive fetal movement. She denies leakage of fluid or vaginal bleeding. Patient denies headache, vision changes, chest pain, shortness of breath, or RUQ pain.   Prenatal History/Complications: A1gDM GBS bactiuria   Past Medical History: Past Medical History:  Diagnosis Date  . Bronchitis   . Diabetes mellitus without complication (HCC)   . Gestational diabetes   . Medical history non-contributory   . Pyelonephritis     Past Surgical History: Past Surgical History:  Procedure Laterality Date  . MULTIPLE TOOTH EXTRACTIONS      Obstetrical History: OB History    Gravida Para Term Preterm AB Living   1             SAB TAB Ectopic Multiple Live Births                  Social History: Social History   Social History  . Marital status: Single    Spouse name: N/A  . Number of children: N/A  . Years of education: N/A   Social History Main Topics  . Smoking status: Former Smoker    Packs/day: 0.25    Years: 5.00    Types: Cigars    Quit date: 08/17/2016  . Smokeless tobacco: Never Used     Comment: Stopped on her own at + UPT  . Alcohol use No     Comment: occasionally before pregnancy  . Drug use: No     Comment: none since April  . Sexual activity: Not Currently    Birth control/ protection: None   Other Topics Concern  . None   Social History Narrative  . None    Family History: Family History  Problem Relation Age of Onset  . Birth defects Maternal Grandfather   . Fibroids Mother   . Diabetes Maternal Grandmother     Allergies: No Known Allergies  Prescriptions Prior to Admission  Medication Sig Dispense Refill Last Dose  . ACCU-CHEK FASTCLIX LANCETS MISC 1 Units by Percutaneous route 4 (four) times daily. 100 each  12 Taking  . diphenhydrAMINE (BENADRYL) 25 mg capsule Take 1 capsule (25 mg total) by mouth at bedtime as needed for sleep. (Patient not taking: Reported on 11/09/2016) 30 capsule 0 Not Taking  . doxylamine, Sleep, (UNISOM) 25 MG tablet Take 1 tablet (25 mg total) by mouth at bedtime as needed (nausea and vomiting). (Patient not taking: Reported on 11/09/2016) 30 tablet 2 Not Taking  . glucose blood (ACCU-CHEK GUIDE) test strip Use as instructed 100 each 12 Taking  . Prenatal Vit-Fe Fumarate-FA (PREPLUS) 27-1 MG TABS Take 1 tablet by mouth daily. 30 tablet 13 Taking  . ranitidine (ZANTAC) 150 MG capsule Take 1 capsule (150 mg total) by mouth 2 (two) times daily. 60 capsule 0 Taking     Review of Systems   All systems reviewed and negative except as stated in HPI  Blood pressure (!) 149/84, pulse 84, temperature 99.1 F (37.3 C), resp. rate 16, height 5\' 4"  (1.626 m), weight 89.8 kg (198 lb), last menstrual period 02/19/2016. General appearance: alert, cooperative, appears stated age and no distress Lungs: clear to auscultation bilaterally Heart: regular rate and rhythm Abdomen: soft, non-tender; bowel sounds normal Extremities: No calf swelling or tenderness Presentation: cephalic Fetal monitoring: 140bpm/mod/+ac/-dc Uterine  activity: Minimal Dilation: Closed Effacement (%): 30 Station: -3 Exam by:: Sherrye Payor  Prenatal labs: ABO, Rh: B/Negative/-- (05/17 1610) Antibody: Negative (05/17 0808) Rubella: !Error! RPR: Non Reactive (05/31 0907)  HBsAg: Negative (05/17 9604)  HIV:   Non Reactive (05/31 0907) GBS:  Positive bactiuria 2 hr Glucola: Elevated, 92/186/156, gDMA1 Genetic screening: Sickle screen neg Anatomy US: Normal  Prenatal Transfer Tool  Maternal Diabetes: Yes:  Diabetes Type:  Diet controlled gestational Genetic Screening: Normal Maternal Ultrasounds/Referrals: Normal Fetal Ultrasounds or other Referrals:  None Maternal Substance Abuse:  No Significant  Maternal Medications:  Meds include: Zantac Significant Maternal Lab Results: Lab values include: Group B Strep positive, Other: P:C ratio 0.46  Results for orders placed or performed during the hospital encounter of 11/09/16 (from the past 24 hour(s))  Protein / creatinine ratio, urine   Collection Time: 11/09/16  4:30 PM  Result Value Ref Range   Creatinine, Urine 63.00 mg/dL   Total Protein, Urine 29 mg/dL   Protein Creatinine Ratio 0.46 (H) 0.00 - 0.15 mg/mg[Cre]  Urinalysis, Routine w reflex microscopic   Collection Time: 11/09/16  4:35 PM  Result Value Ref Range   Color, Urine YELLOW YELLOW   APPearance CLOUDY (A) CLEAR   Specific Gravity, Urine 1.006 1.005 - 1.030   pH 7.0 5.0 - 8.0   Glucose, UA NEGATIVE NEGATIVE mg/dL   Hgb urine dipstick SMALL (A) NEGATIVE   Bilirubin Urine NEGATIVE NEGATIVE   Ketones, ur NEGATIVE NEGATIVE mg/dL   Protein, ur NEGATIVE NEGATIVE mg/dL   Nitrite NEGATIVE NEGATIVE   Leukocytes, UA LARGE (A) NEGATIVE   RBC / HPF 6-30 0 - 5 RBC/hpf   WBC, UA TOO NUMEROUS TO COUNT 0 - 5 WBC/hpf   Bacteria, UA MANY (A) NONE SEEN   Squamous Epithelial / LPF 6-30 (A) NONE SEEN   Mucous PRESENT   CBC with Differential/Platelet   Collection Time: 11/09/16  5:03 PM  Result Value Ref Range   WBC 7.5 4.0 - 10.5 K/uL   RBC 4.07 3.87 - 5.11 MIL/uL   Hemoglobin 12.1 12.0 - 15.0 g/dL   HCT 54.0 (L) 98.1 - 19.1 %   MCV 88.2 78.0 - 100.0 fL   MCH 29.7 26.0 - 34.0 pg   MCHC 33.7 30.0 - 36.0 g/dL   RDW 47.8 29.5 - 62.1 %   Platelets 305 150 - 400 K/uL   Neutrophils Relative % 67 %   Neutro Abs 5.0 1.7 - 7.7 K/uL   Lymphocytes Relative 26 %   Lymphs Abs 2.0 0.7 - 4.0 K/uL   Monocytes Relative 6 %   Monocytes Absolute 0.5 0.1 - 1.0 K/uL   Eosinophils Relative 1 %   Eosinophils Absolute 0.1 0.0 - 0.7 K/uL   Basophils Relative 0 %   Basophils Absolute 0.0 0.0 - 0.1 K/uL  Comprehensive metabolic panel   Collection Time: 11/09/16  5:03 PM  Result Value Ref Range    Sodium 134 (L) 135 - 145 mmol/L   Potassium 4.2 3.5 - 5.1 mmol/L   Chloride 104 101 - 111 mmol/L   CO2 23 22 - 32 mmol/L   Glucose, Bld 115 (H) 65 - 99 mg/dL   BUN <5 (L) 6 - 20 mg/dL   Creatinine, Ser 3.08 0.44 - 1.00 mg/dL   Calcium 9.2 8.9 - 65.7 mg/dL   Total Protein 6.8 6.5 - 8.1 g/dL   Albumin 2.9 (L) 3.5 - 5.0 g/dL   AST 16 15 - 41 U/L  ALT 10 (L) 14 - 54 U/L   Alkaline Phosphatase 162 (H) 38 - 126 U/L   Total Bilirubin 0.5 0.3 - 1.2 mg/dL   GFR calc non Af Amer >60 >60 mL/min   GFR calc Af Amer >60 >60 mL/min   Anion gap 7 5 - 15  Results for orders placed or performed in visit on 11/09/16 (from the past 24 hour(s))  POCT urinalysis dip (device)   Collection Time: 11/09/16  3:47 PM  Result Value Ref Range   Glucose, UA NEGATIVE NEGATIVE mg/dL   Bilirubin Urine NEGATIVE NEGATIVE   Ketones, ur NEGATIVE NEGATIVE mg/dL   Specific Gravity, Urine 1.015 1.005 - 1.030   Hgb urine dipstick TRACE (A) NEGATIVE   pH 7.0 5.0 - 8.0   Protein, ur NEGATIVE NEGATIVE mg/dL   Urobilinogen, UA 0.2 0.0 - 1.0 mg/dL   Nitrite NEGATIVE NEGATIVE   Leukocytes, UA SMALL (A) NEGATIVE    Patient Active Problem List   Diagnosis Date Noted  . Marijuana abuse 11/02/2016  . Gestational diabetes mellitus (GDM) affecting first pregnancy 09/17/2016  . Supervision of high risk pregnancy, antepartum, third trimester 09/03/2016  . Smoker 09/03/2016  . GBS (group b Streptococcus) UTI complicating pregnancy, second trimester 08/20/2016  . Pyelonephritis affecting pregnancy in second trimester 08/17/2016    Assessment: Debbie Mccullough is a 21 y.o. G1P0 at [redacted]w[redacted]d here for IOL 2/2 PreE w/o severe features  #Labor: Start Cytotec for cervical ripening. #Pain: Labor support, IV pain meds as needed, Epidural when ready #FWB: Cat 1 #GBS:  Positive bactiuria #MOF: Bottle #MOC: Undecided, doesn't want hormonal right now #Circ: N/A  Conard Novak 11/09/2016, 7:34 PM  OB FELLOW HISTORY AND PHYSICAL  ATTESTATION  I have seen and examined this patient; I agree with above documentation in the resident's note.    Frederik Pear, MD OB Fellow 11/09/2016, 8:53 PM

## 2016-11-09 NOTE — Progress Notes (Signed)
   PRENATAL VISIT NOTE  Subjective:  Debbie Mccullough is a 21 y.o. G1P0 at 3690w5d being seen today for ongoing prenatal care.  She is currently monitored for the following issues for this high-risk pregnancy and has Pyelonephritis affecting pregnancy in second trimester; GBS (group b Streptococcus) UTI complicating pregnancy, second trimester; Supervision of high risk pregnancy, antepartum, third trimester; Smoker; Gestational diabetes mellitus (GDM) affecting first pregnancy; and Marijuana abuse on her problem list.  Patient reports heartburn currently using Zantac.  Contractions: Irregular. Vag. Bleeding: None.  Movement: Present. Denies leaking of fluid. No headaches, vision changes, RUQ pain.  The following portions of the patient's history were reviewed and updated as appropriate: allergies, current medications, past family history, past medical history, past social history, past surgical history and problem list. Problem list updated.  Objective:   Vitals:   11/09/16 1533 11/09/16 1535  BP: (!) 142/61 (!) 150/87  Pulse: 72 89  Weight: 198 lb 12.8 oz (90.2 kg)     Fetal Status:   Fundal Height: 38 cm Movement: Present  Presentation: Vertex  General:  Alert, oriented and cooperative. Patient is in no acute distress.  Skin: Skin is warm and dry. No rash noted.   Cardiovascular: Normal heart rate noted  Respiratory: Normal respiratory effort, no problems with respiration noted  Abdomen: Soft, gravid, appropriate for gestational age.  Pain/Pressure: Present     Pelvic: Cervical exam deferred        Extremities: Normal range of motion.  Edema: Trace  Mental Status:  Normal mood and affect. Normal behavior. Normal judgment and thought content.   Assessment and Plan:  Pregnancy: G1P0 at 3590w5d  1. Elevated blood pressure affecting pregnancy BP today elevated at 142/61 and 150/87. Has been normotensive this pregnancy. No headache, vision changes, RUQ pain. -Admit to MAU for observation,  recheck BP in 4 hours -If elevated, admit for IOL for gestational HTN  2. A1 GDM Didn't bring glucose log book today or meter. Reviewed normal fasting and 2hr PP values.  -Growth US scheduled for 11/19/16  3. Supervision of high risk first pregnancy, antepartum, third trimester -Continue Zantac for GERD   There are no diagnoses linked to this encounter. Term labor symptoms and general obstetric precautions including but not limited to vaginal bleeding, contractions, leaking of fluid and fetal movement were reviewed in detail with the patient. Please refer to After Visit Summary for other counseling recommendations.  No Follow-up on file.   Jeanie CooksSarah Kurstyn Larios, Medical Student

## 2016-11-09 NOTE — MAU Note (Signed)
Elevated BP, sent from office for further eval.  Denies HA,visual changes, epigastric pain or increase in swelling

## 2016-11-09 NOTE — Progress Notes (Signed)
History     CSN: [id number]  Arrival date & time 11/09/16  1618   First Provider Initiated Contact with Patient 11/09/16 1748      Chief Complaint  Patient presents with  . Hypertension    21 y/o female G1P0000 with diet-controlled gestational diabetes presents to MAU today after being seen in clinic earlier this afternoon by Dr[facility]. Her BP was noted to be elevated in clinic, up to systolic 150's at 1533 today. She was subsequently sent to MAU for 4-hour BP recheck. She denies HA, vision changes, RUQ abd pain, vaginal bleeding, and peripheral edema. No other concerns or complaints at this time.    Hypertension  This is a new problem. The current episode started today. The problem is unchanged. Pertinent negatives include no anxiety, blurred vision, chest pain, headaches, malaise/fatigue, palpitations, peripheral edema, shortness of breath or sweats. Past treatments include nothing.    Past Medical History:  Diagnosis Date  . Bronchitis   . Diabetes mellitus without complication (HCC)   . Gestational diabetes   . Medical history non-contributory   . Pyelonephritis     Past Surgical History:  Procedure Laterality Date  . MULTIPLE TOOTH EXTRACTIONS      Family History  Problem Relation Age of Onset  . Birth defects Maternal Grandfather   . Fibroids Mother   . Diabetes Maternal Grandmother     Social History  Substance Use Topics  . Smoking status: Former Smoker    Packs/day: 0.25    Years: 5.00    Types: Cigars    Quit date: 08/17/2016  . Smokeless tobacco: Never Used     Comment: Stopped on her own at + UPT  . Alcohol use No     Comment: occasionally before pregnancy    OB History    Gravida Para Term Preterm AB Living   1             SAB TAB Ectopic Multiple Live Births                  Review of Systems  Constitutional: Negative for chills, fever and malaise/fatigue.  Eyes: Negative for blurred vision and visual  disturbance.  Respiratory: Negative for shortness of breath.   Cardiovascular: Negative for chest pain and palpitations.  Gastrointestinal: Negative for abdominal pain, nausea and vomiting.  Genitourinary: Negative for dysuria, frequency, hematuria, vaginal bleeding, vaginal discharge and vaginal pain.  Musculoskeletal: Negative for back pain.  Neurological: Negative for seizures and headaches.    Allergies  Patient has no known allergies.  Home Medications    BP (!) 149/84   Pulse 84   Temp 99.1 F (37.3 C)   Resp 16   Ht 5\' 4"  (1.626 m)   Wt 198 lb (89.8 kg)   LMP 02/19/2016 (Within Months)   BMI 33.99 kg/m   Physical Exam  Constitutional: She is oriented to person, place, and time. She appears well-developed and well-nourished. No distress.  HENT:  Head: Normocephalic and atraumatic.  Cardiovascular: Normal rate and intact distal pulses.  Exam reveals no gallop and no friction rub.   No murmur heard. Pulmonary/Chest: Effort normal and breath sounds normal.  Abdominal: Soft. Bowel sounds are normal. There is no tenderness.  Musculoskeletal: Normal range of motion. She exhibits no edema or deformity.  Neurological: She is alert and oriented to person, place, and time.  Reflex Scores:      Bicep reflexes are 3+ on the right side  and 3+ on the left side.      Brachioradialis reflexes are 3+ on the right side and 3+ on the left side.      Patellar reflexes are 2+ on the right side and 2+ on the left side.      Achilles reflexes are 2+ on the right side and 2+ on the left side. Skin: Skin is warm and dry. She is not diaphoretic.  Psychiatric: She has a normal mood and affect. Her behavior is normal.    MAU Course  Procedures (including critical care time)  Labs Reviewed  CBC WITH DIFFERENTIAL/PLATELET - Abnormal; Notable for the following:       Result Value   HCT 35.9 (*)    All other components within normal limits  COMPREHENSIVE METABOLIC PANEL - Abnormal; Notable  for the following:    Sodium 134 (*)    Glucose, Bld 115 (*)    BUN <5 (*)    Albumin 2.9 (*)    ALT 10 (*)    Alkaline Phosphatase 162 (*)    All other components within normal limits  PROTEIN / CREATININE RATIO, URINE - Abnormal; Notable for the following:    Protein Creatinine Ratio 0.46 (*)    All other components within normal limits  URINALYSIS, ROUTINE W REFLEX MICROSCOPIC - Abnormal; Notable for the following:    APPearance CLOUDY (*)    Hgb urine dipstick SMALL (*)    Leukocytes, UA LARGE (*)    Bacteria, UA MANY (*)    Squamous Epithelial / LPF 6-30 (*)    All other components within normal limits   No results found.   No diagnosis found.    MDM    Assessment   Patient is a 21 y/o G1P0000 female in no acute distress here for elevated BP. She is otherwise asymptomatic. On PE, UE DTRs were brisk at 3+ bilaterally, otherwise her exam was unremarkable. Patient's labs reveal an elevated protein/creatinine ratio (0.46). BP remains elevated in MAU (140s/90s) after 4 hours, without severe range BP nor signs of end organ damage.  Plan   Admit for IOL

## 2016-11-09 NOTE — Progress Notes (Signed)
LABOR PROGRESS NOTE  Debbie Mccullough is a 21 y.o. G1P0 at 6536w5d  admitted for IOL second to PreE, and type A1 GDM.   Subjective: She is comfortable with out contractions and has no complaints. She is GBS positive, and was worried this is a STI. She is reassured this is not the case. Knows she is having a girl. She plans to feed the baby formula. She does not know about birth control, she thinks she may not want any.   Objective: BP 137/73 (BP Location: Left Arm)   Pulse 71   Temp 97.8 F (36.6 C) (Oral)   Resp 18   Ht 5\' 4"  (1.626 m)   Wt 89.8 kg (198 lb)   LMP 02/19/2016 (Within Months)   BMI 33.99 kg/m  or  Vitals:   11/09/16 1930 11/09/16 1945 11/09/16 2021 11/09/16 2031  BP: (!) 149/84 137/86  137/73  Pulse: 84 87  71  Resp:   18 18  Temp:   97.8 F (36.6 C) 97.8 F (36.6 C)  TempSrc:    Oral  Weight:    89.8 kg (198 lb)  Height:    5\' 4"  (1.626 m)     FHR: BASE LINE 150,  MODERATE VARIABILITY WITH ACCELERATIONS AND NO DECLARATIONS.   Dilation: Closed Effacement (%): 30 Cervical Position: Posterior Station: -3 Presentation: Vertex Exam by:: Sherrye PayorJulie Wenzel,PA  Labs: Lab Results  Component Value Date   WBC 7.5 11/09/2016   HGB 12.1 11/09/2016   HCT 35.9 (L) 11/09/2016   MCV 88.2 11/09/2016   PLT 305 11/09/2016    Patient Active Problem List   Diagnosis Date Noted  . Pre-eclampsia in third trimester 11/09/2016  . Marijuana abuse 11/02/2016  . Gestational diabetes mellitus (GDM) affecting first pregnancy 09/17/2016  . Supervision of high risk pregnancy, antepartum, third trimester 09/03/2016  . Smoker 09/03/2016  . GBS (group b Streptococcus) UTI complicating pregnancy, second trimester 08/20/2016  . Pyelonephritis affecting pregnancy in second trimester 08/17/2016    Assessment / Plan: 21 y.o. G1P0 at 7536w5d here for IOL 2/2 PreE and A1GDM  Labor: latent/ not in labor. Augment with buccal misoprostol 50mcg q 4 hr. Check sugars q4 hours. Continue to  check pressures. Will need penicillin intrapartum  Fetal Wellbeing:  Category I Pain Control:  Epidural once need arises. IV or PO per pt preference until then.   Anticipated MOD:  SVD  Sullivan LoneBrannon L Inman,  11/09/2016, 9:01 PM   I confirm that I have verified the information documented in the medical student's note and that I have also personally reperformed the physical exam and all medical decision making activities.  Raelyn MoraRolitta Kerly Rigsbee, CNM 11/09/2016 9:19 PM

## 2016-11-10 ENCOUNTER — Inpatient Hospital Stay (HOSPITAL_COMMUNITY): Payer: Medicaid Other | Admitting: Anesthesiology

## 2016-11-10 LAB — GLUCOSE, CAPILLARY
GLUCOSE-CAPILLARY: 89 mg/dL (ref 65–99)
GLUCOSE-CAPILLARY: 102 mg/dL — AB (ref 65–99)
GLUCOSE-CAPILLARY: 92 mg/dL (ref 65–99)
Glucose-Capillary: 67 mg/dL (ref 65–99)

## 2016-11-10 LAB — RAPID STREP SCREEN (MED CTR MEBANE ONLY): Streptococcus, Group A Screen (Direct): NEGATIVE

## 2016-11-10 LAB — CBC
HCT: 34.5 % — ABNORMAL LOW (ref 36.0–46.0)
Hemoglobin: 11.5 g/dL — ABNORMAL LOW (ref 12.0–15.0)
MCH: 29.2 pg (ref 26.0–34.0)
MCHC: 33.3 g/dL (ref 30.0–36.0)
MCV: 87.6 fL (ref 78.0–100.0)
PLATELETS: 285 10*3/uL (ref 150–400)
RBC: 3.94 MIL/uL (ref 3.87–5.11)
RDW: 13.6 % (ref 11.5–15.5)
WBC: 20.7 10*3/uL — AB (ref 4.0–10.5)

## 2016-11-10 LAB — TYPE AND SCREEN
ABO/RH(D): B NEG
Antibody Screen: NEGATIVE

## 2016-11-10 LAB — ABO/RH: ABO/RH(D): B NEG

## 2016-11-10 MED ORDER — SODIUM CHLORIDE 0.9 % IV SOLN
2.0000 g | Freq: Four times a day (QID) | INTRAVENOUS | Status: DC
  Administered 2016-11-10 – 2016-11-11 (×3): 2 g via INTRAVENOUS
  Filled 2016-11-10 (×4): qty 2000

## 2016-11-10 MED ORDER — FENTANYL 2.5 MCG/ML BUPIVACAINE 1/10 % EPIDURAL INFUSION (WH - ANES)
14.0000 mL/h | INTRAMUSCULAR | Status: DC | PRN
  Administered 2016-11-10 – 2016-11-11 (×2): 14 mL/h via EPIDURAL
  Filled 2016-11-10 (×2): qty 100

## 2016-11-10 MED ORDER — EPHEDRINE 5 MG/ML INJ
10.0000 mg | INTRAVENOUS | Status: DC | PRN
  Filled 2016-11-10: qty 2

## 2016-11-10 MED ORDER — DIPHENHYDRAMINE HCL 50 MG/ML IJ SOLN: 12.5000 mg | INTRAMUSCULAR | Status: DC | PRN

## 2016-11-10 MED ORDER — LACTATED RINGERS IV SOLN
INTRAVENOUS | Status: DC
  Administered 2016-11-10: 300 mL via INTRAUTERINE

## 2016-11-10 MED ORDER — PHENYLEPHRINE 40 MCG/ML (10ML) SYRINGE FOR IV PUSH (FOR BLOOD PRESSURE SUPPORT)
80.0000 ug | PREFILLED_SYRINGE | INTRAVENOUS | Status: DC | PRN
  Administered 2016-11-10: 80 ug via INTRAVENOUS
  Filled 2016-11-10: qty 5

## 2016-11-10 MED ORDER — GENTAMICIN SULFATE 40 MG/ML IJ SOLN
160.0000 mg | Freq: Three times a day (TID) | INTRAVENOUS | Status: DC
  Administered 2016-11-10 – 2016-11-11 (×3): 160 mg via INTRAVENOUS
  Filled 2016-11-10 (×3): qty 4

## 2016-11-10 MED ORDER — LIDOCAINE HCL (PF) 1 % IJ SOLN
INTRAMUSCULAR | Status: DC | PRN
  Administered 2016-11-10: 6 mL
  Administered 2016-11-10: 4 mL

## 2016-11-10 MED ORDER — FENTANYL CITRATE (PF) 100 MCG/2ML IJ SOLN
100.0000 ug | INTRAMUSCULAR | Status: DC | PRN
  Administered 2016-11-10 (×3): 100 ug via INTRAVENOUS
  Filled 2016-11-10 (×3): qty 2

## 2016-11-10 MED ORDER — LACTATED RINGERS IV SOLN: 500.0000 mL | Freq: Once | INTRAVENOUS | Status: DC

## 2016-11-10 MED ORDER — ZOLPIDEM TARTRATE 5 MG PO TABS
5.0000 mg | ORAL_TABLET | Freq: Once | ORAL | Status: AC
  Administered 2016-11-10: 5 mg via ORAL
  Filled 2016-11-10: qty 1

## 2016-11-10 MED ORDER — PHENYLEPHRINE 40 MCG/ML (10ML) SYRINGE FOR IV PUSH (FOR BLOOD PRESSURE SUPPORT)
80.0000 ug | PREFILLED_SYRINGE | INTRAVENOUS | Status: AC | PRN
  Administered 2016-11-10 (×3): 80 ug via INTRAVENOUS
  Filled 2016-11-10: qty 10

## 2016-11-10 MED ORDER — OXYTOCIN 40 UNITS IN LACTATED RINGERS INFUSION - SIMPLE MED
1.0000 m[IU]/min | INTRAVENOUS | Status: DC
  Administered 2016-11-10: 2 m[IU]/min via INTRAVENOUS

## 2016-11-10 NOTE — Progress Notes (Signed)
LABOR PROGRESS NOTE  Debbie Mccullough is a 21 y.o. G1P0 at 6379w6d  admitted for IOL second to PreE, and type A1 GDM.   Subjective: Pt is pacing the room and states trouble sleeping due to the pain. She requests medications to help with pain/to sleep.  Objective: BP 139/80 (BP Location: Left Arm)   Pulse 77   Temp 98.7 F (37.1 C) (Oral)   Resp 16   Ht 5\' 4"  (1.626 m)   Wt 89.8 kg (198 lb)   LMP 02/19/2016 (Within Months)   BMI 33.99 kg/m  or  Vitals:   11/09/16 2021 11/09/16 2031 11/09/16 2120 11/10/16 0112  BP:  137/73 128/75 139/80  Pulse:  71 72 77  Resp: 18 18 18 16   Temp: 97.8 F (36.6 C) 97.8 F (36.6 C) 98.7 F (37.1 C) 98.7 F (37.1 C)  TempSrc:  Oral Oral Oral  Weight:  89.8 kg (198 lb)    Height:  5\' 4"  (1.626 m)      FHR: baseline 145 with minimal variability w/o accelerations or decelerations  Dilation: Fingertip Effacement (%): 50 Cervical Position: Posterior Station: -3 Presentation: Vertex Exam by:: Debbie SalaamLynnsey Johnson, RN   Labs: Lab Results  Component Value Date   WBC 7.5 11/09/2016   HGB 12.1 11/09/2016   HCT 35.9 (L) 11/09/2016   MCV 88.2 11/09/2016   PLT 305 11/09/2016    Patient Active Problem List   Diagnosis Date Noted  . Pre-eclampsia in third trimester 11/09/2016  . Marijuana abuse 11/02/2016  . Gestational diabetes mellitus (GDM) affecting first pregnancy 09/17/2016  . Supervision of high risk pregnancy, antepartum, third trimester 09/03/2016  . Smoker 09/03/2016  . GBS (group b Streptococcus) UTI complicating pregnancy, second trimester 08/20/2016  . Pyelonephritis affecting pregnancy in second trimester 08/17/2016    Assessment / Plan: 21 y.o. G1P0 at 6679w6d here for IOL 2/2 PreE, A1GDM  Labor: latent, place foley bulb Fetal Wellbeing:  Category II Pain Control: Ambien to help her sleep Anticipated MOD:  Vaginal   Sullivan LoneBrannon L Inman, Medical Student 11/10/2016, 2:09 AM  I confirm that I have verified the information documented  in the medical student's note and that I have also personally reperformed the physical exam and all medical decision making activities.  Raelyn MoraRolitta Labradford Schnitker, CNM 11/10/2016 2:36 AM

## 2016-11-10 NOTE — Progress Notes (Signed)
Pharmacy Antibiotic Note  Debbie Mccullough is a 21 y.o. female admitted on 11/09/2016 with IOL 2/2 preeclampsia. Patient now with low-grade temp and suspect Triple I. Pharmacy has been consulted for gentamicin dosing.  Plan: Start gentamicin 160mg  IV q8h.  Monitor levels if indicated.  Height: 5\' 4"  (162.6 cm) Weight: 198 lb (89.8 kg) IBW/kg (Calculated) : 54.7 kg  Temp (24hrs), Avg:98.6 F (37 C), Min:97.8 F (36.6 C), Max:99.8 F (37.7 C)   Recent Labs Lab 11/09/16 1703  WBC 7.5  CREATININE 0.50    Estimated Creatinine Clearance: 119.6 mL/min (by C-G formula based on SCr of 0.5 mg/dL).    No Known Allergies  Antimicrobials this admission: Penicillin 5 million units x1 7/24 Ampicillin 7/24 >>  Gentamicin 7/24 >>  Microbiology results: GBS positive  Thank you for allowing pharmacy to be a part of this patient's care.  Benetta SparVictoria Nickolus Wadding 11/10/2016 2:25 PM

## 2016-11-10 NOTE — Progress Notes (Addendum)
Debbie Mccullough is a 21 y.o. G1P0 at 8258w6d  admitted for induction of labor due to pre-eclampsia without severe features.   Subjective: Patient sleeping in bed.   Objective: Vitals:   11/10/16 1958 11/10/16 2001 11/10/16 2031 11/10/16 2101  BP:  124/60 (!) 128/57 139/73  Pulse:  91 97 (!) 102  Resp: 20 20 18 20   Temp: 98.9 F (37.2 C)     TempSrc: Axillary     SpO2: 100%     Weight:      Height:       No intake/output data recorded.  FHT:  FHR: 155 bpm, variability: moderate,  accelerations:  Abscent,  decelerations:  Present occasional non-repetitive variables UC:   irregular, every 2-4 minutes SVE:   Dilation: 5 Effacement (%): 80 Station: -3 Exam by:: Dr. Nira Retortegele Pitocin @ 1 mu/min MVu 160 Amnioinfusion on-going  Labs: Lab Results  Component Value Date   WBC 20.7 (H) 11/10/2016   HGB 11.5 (L) 11/10/2016   HCT 34.5 (L) 11/10/2016   MCV 87.6 11/10/2016   PLT 285 11/10/2016    Assessment / Plan: Induction of labor, progressing slowly BP under control; no need for BP medication at this time.  Labor: progressing slowly Fetal Wellbeing:  Category I Pain Control:  Epidural Anticipated MOD:  NSVD Debbie Mccullough 11/10/2016, 9:23 PM

## 2016-11-10 NOTE — Progress Notes (Signed)
LABOR PROGRESS NOTE  Debbie Mccullough is a 21 y.o. G1P0 at 8246w6d  admitted for IOL for gestational HTN  Subjective: Pt  would like epidural. Denies any other concernes  Objective: BP (!) 144/83   Pulse (!) 128   Temp 99.2 F (37.3 C) (Oral)   Resp 20   Ht 5\' 4"  (1.626 m)   Wt 198 lb (89.8 kg)   LMP 02/19/2016 (Within Months)   BMI 33.99 kg/m  or  Vitals:   11/10/16 1547 11/10/16 1821 11/10/16 1829 11/10/16 1830  BP:   139/80 (!) 144/83  Pulse:   (!) 140 (!) 128  Resp: (!) 30 20 20 20   Temp: 99.2 F (37.3 C)     TempSrc: Oral     Weight:      Height:         Dilation: 5 Effacement (%): 80 Cervical Position: Middle Station: -3 Presentation: Vertex Exam by:: Dr. Nira Retortegele FHT: baseline rate 180, moderate varibility, +acel, variable decel Toco: ctx q1-5  Labs: Lab Results  Component Value Date   WBC 20.7 (H) 11/10/2016   HGB 11.5 (L) 11/10/2016   HCT 34.5 (L) 11/10/2016   MCV 87.6 11/10/2016   PLT 285 11/10/2016    Assessment / Plan: 21 y.o. G1P0 at 6546w6d here for IOL for gHTN  Labor: Pitt off d/t variable decels. AROM at 1800, clear. Will place FSE and IUPC after patient gets Epidural d/t poor tolerance with exams Fetal Wellbeing:  Cat II (d/t fetal tachycardia and and variables) Pain Control:  Pt will get Epidural Anticipated MOD:  SVD  Frederik PearJulie P Degele, MD 11/10/2016, 6:40 PM

## 2016-11-10 NOTE — Progress Notes (Signed)
Patient ID: Debbie Mccullough, female   DOB: 1995-06-07, 21 y.o.   MRN: [id number] Labor Progress Note  S: Patient seen & examined for progress of labor. Patient comfortable. Getting an epidural   O: BP (!) 108/56 (BP Location: Left Arm)   Pulse (!) 118   Temp 99.2 F (37.3 C) (Oral)   Resp (!) 30   Ht 5\' 4"  (1.626 m)   Wt 89.8 kg (198 lb)   LMP 02/19/2016 (Within Months)   BMI 33.99 kg/m   FHT: 175bpm, mod var, no accels, variable decels TOCO: irregular, patient looks comfortable during contractions  CVE: Dilation: 5 Effacement (%): 80 Cervical Position: Middle Station: -3 Presentation: Vertex Exam by:: Dr. Nira Retortegele  A&P: 21 y.o. G1P0 4354w6d for IOL for pre-eclampsia w/o SF and A1GDM.  Currently on 0 milli-units of pitocin AROM @1800 ,  placed IUPC for amnioinfusion Continue current management Anticipate SVD  Debbie Carrell Palmatier, DO FM Resident PGY-1 11/10/2016 5:38 PM

## 2016-11-10 NOTE — Progress Notes (Addendum)
Patient ID: Debbie Mccullough, female   DOB: [birth year], 21 y.o.   MRN: [id number]   S: Patient seen & examined for progress of labor. Patient shivering. Complaining of new onset sore throat.  O:  Vitals:   11/10/16 1401 11/10/16 1445  BP: (!) 108/56   Pulse: (!) 128 (!) 118  Resp: (!) 25 (!) 40  Temp: (!) 100.6 F (38.1 C) (!) 100.9 F (38.3 C)     FHT: 180bpm, mod var, +accels, no decels TOCO: irregular, patient looks comfortable during contractions  Dilation: 4 Effacement (%): 60 Cervical Position: Posterior Station: -3 Presentation: Vertex Exam by:: Milus GlazierJennifer Hamilton, RN   A&P: 21 yo G1P0 at 242w6d here for IOL for pre-eclampsia without severe features and A1GDM  S/p foley bulb, s/p cytotec x1 Started on amp and gent for Triple I. 2 fevers >100.4 within 30 min and tachycardia of fetus. Given bolus of fluid Give tylenol for fever Start on pitocin to augment labor Anticipate SVD  [place] Lilou Kneip, DO FM Resident PGY-1 11/10/2016 2:34 PM

## 2016-11-10 NOTE — Anesthesia Preprocedure Evaluation (Signed)
Anesthesia Evaluation  Patient identified by MRN, date of birth, ID band Patient awake    Reviewed: Allergy & Precautions, H&P , Patient's Chart, lab work & pertinent test results  Airway Mallampati: II  TM Distance: >3 FB Neck ROM: full    Dental  (+) Teeth Intact   Pulmonary former smoker,    breath sounds clear to auscultation       Cardiovascular  Rhythm:regular Rate:Normal     Neuro/Psych    GI/Hepatic   Endo/Other  diabetes, Gestational  Renal/GU      Musculoskeletal   Abdominal   Peds  Hematology   Anesthesia Other Findings       Reproductive/Obstetrics (+) Pregnancy                             Anesthesia Physical Anesthesia Plan  ASA: II  Anesthesia Plan: Epidural   Post-op Pain Management:    Induction:   PONV Risk Score and Plan:   Airway Management Planned:   Additional Equipment:   Intra-op Plan:   Post-operative Plan:   Informed Consent: I have reviewed the patients History and Physical, chart, labs and discussed the procedure including the risks, benefits and alternatives for the proposed anesthesia with the patient or authorized representative who has indicated his/her understanding and acceptance.   Dental Advisory Given  Plan Discussed with:   Anesthesia Plan Comments: (Labs checked- platelets confirmed with RN in room. Fetal heart tracing, per RN, reported to be stable enough for sitting procedure. Discussed epidural, and patient consents to the procedure:  included risk of possible headache,backache, failed block, allergic reaction, and nerve injury. This patient was asked if she had any questions or concerns before the procedure started.)        Anesthesia Quick Evaluation

## 2016-11-10 NOTE — Progress Notes (Signed)
Patient ID: Debbie Mccullough, female   DOB: Nov 19, 1995, 21 y.o.   MRN: [id number] Labor Progress Note  S: Patient examined for progress of labor. Patient comfortable.   O: BP 133/82   Pulse (!) 105   Temp 98.6 F (37 C) (Oral)   Resp 18   Ht 5\' 4"  (1.626 m)   Wt 89.8 kg (198 lb)   LMP 02/19/2016 (Within Months)   BMI 33.99 kg/m   FHT: 150bpm, mod var, +accels, no decels TOCO: q2-316min, patient looks comfortable during contractions  CVE: Dilation: 4 Effacement (%): 80 Cervical Position: Posterior Station: -2 Presentation: Vertex Exam by:: julie, MD  A&P: 21 y.o. G1P0 3232w6d IOL for pre-eclampsia w/o severe features and A1GDM.  S/p foley bulb and 1 cytotec Continue current management q4hr CBG and monitor BPs Anticipate SVD  [name], DO [facility] Res[facility] PGY-1 11/10/2016 11:25 PM

## 2016-11-10 NOTE — Progress Notes (Signed)
LABOR PROGRESS NOTE  Debbie Mccullough is a 21 y.o. G1P0 at 8847w6d  admitted for IOL second to PreE, and type A1 GDM.   Subjective: Pt is asleep, resting comfortably. Support in room request I let her sleep and come back later to talk to her.   Objective: BP 124/60   Pulse 91   Temp 98.9 F (37.2 C) (Axillary)   Resp 20   Ht 5\' 4"  (1.626 m)   Wt 89.8 kg (198 lb)   LMP 02/19/2016 (Within Months)   SpO2 100%   BMI 33.99 kg/m  or  Vitals:   11/10/16 1947 11/10/16 1950 11/10/16 1958 11/10/16 2001  BP: (!) 124/52 123/60  124/60  Pulse: (!) 104 96  91  Resp:   20 20  Temp:   98.9 F (37.2 C)   TempSrc:   Axillary   SpO2:   100%   Weight:      Height:        FHR: baseline 155 with moderate variability no accelerations or decelerations   Dilation: 5 Effacement (%): 80 Cervical Position: Middle Station: -3 Presentation: Vertex Exam by:: Dr. Nira Retortegele  Labs: Lab Results  Component Value Date   WBC 20.7 (H) 11/10/2016   HGB 11.5 (L) 11/10/2016   HCT 34.5 (L) 11/10/2016   MCV 87.6 11/10/2016   PLT 285 11/10/2016    Patient Active Problem List   Diagnosis Date Noted  . Pre-eclampsia in third trimester 11/09/2016  . Marijuana abuse 11/02/2016  . Gestational diabetes mellitus (GDM) affecting first pregnancy 09/17/2016  . Supervision of high risk pregnancy, antepartum, third trimester 09/03/2016  . Smoker 09/03/2016  . GBS (group b Streptococcus) UTI complicating pregnancy, second trimester 08/20/2016  . Pyelonephritis affecting pregnancy in second trimester 08/17/2016    Assessment / Plan: 21 y.o. G1P0 at 6847w6d here for IOL  Labor: IUPC in place. Plan for amnioinfusion, and restarting oxytocin with close monitoring of fetal tolerance to said interventions  Fetal Wellbeing:  Category 1 Pain Control:  epidural Anticipated MOD:  SVD, pending fetal tolerance. recurrent late decelerations will prompt Caesarian   Sullivan LoneBrannon L Arliss Hepburn,  11/10/2016, 8:48 PM

## 2016-11-10 NOTE — Progress Notes (Signed)
Patient ID: Debbie Mccullough, female   DOB: 14-Nov-1995, 21 y.o.   MRN: [id number] Labor Progress Note  S: Patient seen & examined for progress of labor. Patient comfortable.   O: BP (!) 146/88   Pulse 87   Temp 98.7 F (37.1 C) (Oral)   Resp 20   Ht 5\' 4"  (1.626 m)   Wt 89.8 kg (198 lb)   LMP 02/19/2016 (Within Months)   BMI 33.99 kg/m   FHT: 155bpm, mod var, +accels, variable decels TOCO: q2-185min, patient looks comfortable during contractions  CVE: Dilation: 1.5 Effacement (%): 70 Cervical Position: Posterior Station: -2 Presentation: Vertex Exam by:: Raelyn Moraolitta Dawson, CNM   A&P: 21 y.o. G1P0 127w6d for IOL for pre-eclampsia and A1GDM.  Foley bulb @0245 - still intact, s/p 1 cytotec CBG's wnl, continue to check q4 Blood pressures wnl, last bp minimally elevated, continue to monitor Continue current management Anticipate SVD  [place] Manisha Cancel, DO FM Resident PGY-1 11/10/2016 9:06 AM

## 2016-11-10 NOTE — Progress Notes (Signed)
   Debbie Mccullough is a 21 y.o. G1P0 at 3389w6d  admitted for IOL for pre-eclampsia without severe features.   Subjective: Patient resting in bed  Objective: Vitals:   11/10/16 2131 11/10/16 2155 11/10/16 2200 11/10/16 2231  BP: (!) 152/78  (!) 138/57 122/70  Pulse: (!) 113  (!) 116 (!) 103  Resp: 20 20  20   Temp:    98.8 F (37.1 C)  TempSrc:    Oral  SpO2:      Weight:      Height:       No intake/output data recorded.  FHT:  FHR: 150 bpm, variability: moderate,  accelerations:  Present,  decelerations:  Present occasional decels, non-repetitive UC:   irregular, every 2-4 minutes SVE:   Dilation: 5 Effacement (%): 80 Station: -3 Exam by:: Dr. Nira Retortegele Pitocin @ 1 mu/min  Labs: Lab Results  Component Value Date   WBC 20.7 (H) 11/10/2016   HGB 11.5 (L) 11/10/2016   HCT 34.5 (L) 11/10/2016   MCV 87.6 11/10/2016   PLT 285 11/10/2016    Assessment / Plan: Protracted active phase; however patient's cervix is now much thinner. MVU's now adequate at 200 at 2230; will continue to monitor tracing and MVU. If patient does not make changes over two hours, may consider c-section. Patient is aware of this possibility.   Labor: protacted active phase Fetal Wellbeing:  Category II Pain Control:  Epidural Anticipated MOD:  NSVD  Marylene LandKathryn Lorraine Worth Kober 11/10/2016, 10:47 PM

## 2016-11-10 NOTE — Anesthesia Pain Management Evaluation Note (Signed)
  [facility] Pain Management Visit Note  Patient: Debbie Mccullough, 21 y.o., female  "Hello I am a member of the anesthesia team at [facility]. We have an anesthesia team available at all times to provide care throughout the hospital, including epidural management and anesthesia for C-section. I don't know your plan for the delivery whether it a natural birth, water birth, IV sedation, nitrous supplementation, doula or epidural, but we want to meet your pain goals."   1.Was your pain managed to your expectations on prior hospitalizations?   No prior hospitalizations  2.What is your expectation for pain management during this hospitalization?     Labor support without medications and Epidural  3.How can we help you reach that goal? Pt open to discussion about pain relief measaures  Record the patient's initial score and the patient's pain goal.   Pain: 1  Pain Goal: 3 [facility] wants you to be able to say your pain was always managed very well.  Damare Serano 11/10/2016

## 2016-11-10 NOTE — Anesthesia Procedure Notes (Signed)
Epidural Patient location during procedure: OB  Staffing Anesthesiologist: Skylie Hiott  Preanesthetic Checklist Completed: patient identified, site marked, surgical consent, pre-op evaluation, timeout performed, IV checked, risks and benefits discussed and monitors and equipment checked  Epidural Patient position: sitting Prep: site prepped and draped and DuraPrep Patient monitoring: continuous pulse ox and blood pressure Approach: midline Location: L3-L4 Injection technique: LOR air  Needle:  Needle type: Tuohy  Needle gauge: 17 G Needle length: 9 cm and 9 Needle insertion depth: 7 cm Catheter type: closed end flexible Catheter size: 19 Gauge Catheter at skin depth: 14 cm Test dose: negative  Assessment Events: blood not aspirated, injection not painful, no injection resistance, negative IV test and no paresthesia  Additional Notes Dosing of Epidural:  1st dose, through catheter .............................................  Xylocaine 40 mg  2nd dose, through catheter, after waiting 3 minutes.........Xylocaine 60 mg    As each dose occurred, patient was free of IV sx; and patient exhibited no evidence of SA injection.  Patient is more comfortable after epidural dosed. Please see RN's note for documentation of vital signs,and FHR which are stable.  Patient reminded not to try to ambulate with numb legs, and that an RN must be present when she attempts to get up.        

## 2016-11-11 ENCOUNTER — Encounter (HOSPITAL_COMMUNITY): Payer: Self-pay

## 2016-11-11 DIAGNOSIS — O99824 Streptococcus B carrier state complicating childbirth: Secondary | ICD-10-CM

## 2016-11-11 DIAGNOSIS — Z3A39 39 weeks gestation of pregnancy: Secondary | ICD-10-CM

## 2016-11-11 DIAGNOSIS — O1414 Severe pre-eclampsia complicating childbirth: Secondary | ICD-10-CM

## 2016-11-11 LAB — RPR: RPR: NONREACTIVE

## 2016-11-11 LAB — GLUCOSE, CAPILLARY: GLUCOSE-CAPILLARY: 78 mg/dL (ref 65–99)

## 2016-11-11 MED ORDER — TETANUS-DIPHTH-ACELL PERTUSSIS 5-2.5-18.5 LF-MCG/0.5 IM SUSP: 0.5000 mL | Freq: Once | INTRAMUSCULAR | Status: DC

## 2016-11-11 MED ORDER — DIPHENHYDRAMINE HCL 25 MG PO CAPS: 25.0000 mg | ORAL_CAPSULE | Freq: Four times a day (QID) | ORAL | Status: DC | PRN

## 2016-11-11 MED ORDER — ACETAMINOPHEN 325 MG PO TABS
650.0000 mg | ORAL_TABLET | ORAL | Status: DC | PRN
Start: 2016-11-11 — End: 2016-11-13
  Administered 2016-11-12 – 2016-11-13 (×2): 650 mg via ORAL
  Filled 2016-11-11 (×2): qty 2

## 2016-11-11 MED ORDER — OXYCODONE HCL 5 MG PO TABS
10.0000 mg | ORAL_TABLET | ORAL | Status: DC | PRN
  Administered 2016-11-11 – 2016-11-12 (×4): 10 mg via ORAL
  Filled 2016-11-11 (×5): qty 2

## 2016-11-11 MED ORDER — BENZOCAINE-MENTHOL 20-0.5 % EX AERO: 1.0000 "application " | INHALATION_SPRAY | CUTANEOUS | Status: DC | PRN

## 2016-11-11 MED ORDER — WITCH HAZEL-GLYCERIN EX PADS: 1.0000 "application " | MEDICATED_PAD | CUTANEOUS | Status: DC | PRN

## 2016-11-11 MED ORDER — SENNOSIDES-DOCUSATE SODIUM 8.6-50 MG PO TABS
2.0000 | ORAL_TABLET | ORAL | Status: DC
  Administered 2016-11-11 – 2016-11-12 (×2): 2 via ORAL
  Filled 2016-11-11 (×2): qty 2

## 2016-11-11 MED ORDER — SIMETHICONE 80 MG PO CHEW: 80.0000 mg | CHEWABLE_TABLET | ORAL | Status: DC | PRN

## 2016-11-11 MED ORDER — ONDANSETRON HCL 4 MG/2ML IJ SOLN: 4.0000 mg | INTRAMUSCULAR | Status: DC | PRN

## 2016-11-11 MED ORDER — PRENATAL MULTIVITAMIN CH
1.0000 | ORAL_TABLET | Freq: Every day | ORAL | Status: DC
  Administered 2016-11-11 – 2016-11-13 (×3): 1 via ORAL
  Filled 2016-11-11 (×3): qty 1

## 2016-11-11 MED ORDER — DIBUCAINE 1 % RE OINT
1.0000 | TOPICAL_OINTMENT | RECTAL | Status: DC | PRN
Start: 2016-11-11 — End: 2016-11-13

## 2016-11-11 MED ORDER — ONDANSETRON HCL 4 MG PO TABS: 4.0000 mg | ORAL_TABLET | ORAL | Status: DC | PRN

## 2016-11-11 MED ORDER — IBUPROFEN 600 MG PO TABS
600.0000 mg | ORAL_TABLET | Freq: Four times a day (QID) | ORAL | Status: DC
  Administered 2016-11-11 – 2016-11-13 (×7): 600 mg via ORAL
  Filled 2016-11-11 (×9): qty 1

## 2016-11-11 MED ORDER — OXYCODONE HCL 5 MG PO TABS
5.0000 mg | ORAL_TABLET | ORAL | Status: DC | PRN
  Administered 2016-11-13 (×2): 5 mg via ORAL
  Filled 2016-11-11 (×2): qty 1

## 2016-11-11 MED ORDER — COCONUT OIL OIL: 1.0000 "application " | TOPICAL_OIL | Status: DC | PRN

## 2016-11-11 MED ORDER — ZOLPIDEM TARTRATE 5 MG PO TABS: 5.0000 mg | ORAL_TABLET | Freq: Every evening | ORAL | Status: DC | PRN

## 2016-11-11 NOTE — Progress Notes (Signed)
LABOR PROGRESS NOTE  Debbie Mccullough is a 21 y.o. G1P0 at 3816w0d  admitted for IOL for pre-eclampsia without severe features.   Subjective: Pt is laying in bed watching tv. She reports feeling her contractions, with some discomfort   Objective: BP 127/65   Pulse 97   Temp 99.8 F (37.7 C) (Oral)   Resp 18   Ht 5\' 4"  (1.626 m)   Wt 89.8 kg (198 lb)   LMP 02/19/2016 (Within Months)   SpO2 100%   BMI 33.99 kg/m  or  Vitals:   11/10/16 2315 11/10/16 2330 11/11/16 0000 11/11/16 0030  BP:  139/65 127/65   Pulse:  (!) 126 97   Resp:  18 18 18   Temp:  99.8 F (37.7 C)    TempSrc:  Oral    SpO2: 100% 100% 100%   Weight:      Height:        FHR: baseline 160, moderate variability with accelerations and no decelerations   Dilation: 5 Effacement (%): 90 Cervical Position: Middle Station: -2 Presentation: Vertex Exam by:: N Deal RN  Labs: Lab Results  Component Value Date   WBC 20.7 (H) 11/10/2016   HGB 11.5 (L) 11/10/2016   HCT 34.5 (L) 11/10/2016   MCV 87.6 11/10/2016   PLT 285 11/10/2016    Patient Active Problem List   Diagnosis Date Noted  . Pre-eclampsia in third trimester 11/09/2016  . Marijuana abuse 11/02/2016  . Gestational diabetes mellitus (GDM) affecting first pregnancy 09/17/2016  . Supervision of high risk pregnancy, antepartum, third trimester 09/03/2016  . Smoker 09/03/2016  . GBS (group b Streptococcus) UTI complicating pregnancy, second trimester 08/20/2016  . Pyelonephritis affecting pregnancy in second trimester 08/17/2016    Assessment / Plan: 21 y.o. G1P0 at 716w0d here for IOL  Labor: latent. She is failing to progress. She is having inadequate contractions with 170 [place] units/5910min at 2 of oxytocin. Increase oxytocin and assess fetal tolerance. BP have remained well controlled.  Fetal Wellbeing:  Category II, fetal tachycardia could be due to Triple I. Continue ABX. Mother is currently not febrile (temp <38) Pain Control:  Epidural   Anticipated MOD: hopefully SVD   Sullivan LoneBrannon L Inman,  11/11/2016, 1:05 AM   I confirm that I have verified the information documented in the student's note and that I have also personally performed the physical exam and all medical decision making activities.

## 2016-11-11 NOTE — Progress Notes (Signed)
LABOR PROGRESS NOTE  Suzzanne Cloudashawna Kommer is a 21 y.o. G1P0 at 3320w0d  admitted for IOL Subjective: Pt is resting. She is anxious that she has not progressed, and that the baby is not tolerating the oxytocin.  Objective: BP (!) 147/68   Pulse (!) 139   Temp 99.3 F (37.4 C)   Resp 20   Ht 5\' 4"  (1.626 m)   Wt 89.8 kg (198 lb)   LMP 02/19/2016 (Within Months)   SpO2 99%   BMI 33.99 kg/m  or  Vitals:   11/11/16 0230 11/11/16 0301 11/11/16 0330 11/11/16 0401  BP: (!) 143/78 (!) 143/65 (!) 130/51 (!) 147/68  Pulse: (!) 108 (!) 113 (!) 106 (!) 139  Resp: 18 20  20   Temp:  99.3 F (37.4 C)    TempSrc:      SpO2: 100%  99%   Weight:      Height:       FHR: baseline of 160 with moderate variability with accelerations and a few late decelerations while on oxytocin. Since off: baseline of 160 with moderate variability with no accelerations of decelerations  Dilation: 6 Effacement (%): 100 Cervical Position: Middle Station: 0 Presentation: Vertex Exam by:: K Jazarah Capili CNM  Labs: Lab Results  Component Value Date   WBC 20.7 (H) 11/10/2016   HGB 11.5 (L) 11/10/2016   HCT 34.5 (L) 11/10/2016   MCV 87.6 11/10/2016   PLT 285 11/10/2016    Patient Active Problem List   Diagnosis Date Noted  . Pre-eclampsia in third trimester 11/09/2016  . Marijuana abuse 11/02/2016  . Gestational diabetes mellitus (GDM) affecting first pregnancy 09/17/2016  . Supervision of high risk pregnancy, antepartum, third trimester 09/03/2016  . Smoker 09/03/2016  . GBS (group b Streptococcus) UTI complicating pregnancy, second trimester 08/20/2016  . Pyelonephritis affecting pregnancy in second trimester 08/17/2016    Assessment / Plan: 21 y.o. G1P0 at 1420w0d here for IOL  Labor: pt is stalling in latent labor, and baby is not tolerating oxytocin. Last attempt resulted in recurrent late decelerations. Plan to give her a break from the oxytocin for a few hours and restart it then, to assess fetal  tolerance to augmentation. BP have been controled. No fever(>38C). Fetal Wellbeing:  Category III before stopping oxytocin. Now category II. tachycardiac likely due to infection. Continue antibiotics  Pain Control:  epidural Anticipated MOD: SVD   Sullivan LoneBrannon L Inman,  11/11/2016, 4:28 AM    I confirm that I have verified the information documented in the student's note and that I have also personally reperformed the physical exam and all medical decision making activities. Luna KitchensKathryn Aleksi Brummet CNM

## 2016-11-11 NOTE — Plan of Care (Signed)
Problem: Safety: Goal: Ability to remain free from injury will improve Outcome: Progressing Fall prevention for mom discussed; mom aware that needs to call for staff before ambulating.  Back to Sleep & crib safety discussed with mom.  Problem: Pain Managment: Goal: General experience of comfort will improve Outcome: Progressing Pt requiring additional pain meds above scheduled meds.  Medications discussed.  Problem: Pain Management: Goal: General experience of comfort will improve and pain level will decrease Outcome: Progressing Pt requiring additional pain meds above scheduled meds.  Will continue to assess for med needs & nonpharmacologic needs.

## 2016-11-11 NOTE — Anesthesia Postprocedure Evaluation (Signed)
Anesthesia Post Note  Patient: Debbie Mccullough  Procedure(s) Performed: * No procedures listed *     Patient location during evaluation: Mother Baby Anesthesia Type: Epidural Level of consciousness: awake and alert and oriented Pain management: pain level controlled Vital Signs Assessment: post-procedure vital signs reviewed and stable Respiratory status: spontaneous breathing and nonlabored ventilation Cardiovascular status: stable Postop Assessment: no headache, patient able to bend at knees, no backache, epidural receding and adequate PO intake Anesthetic complications: no    Last Vitals:  Vitals:   11/11/16 1126 11/11/16 1523  BP: 123/72 116/64  Pulse: 98 (!) 56  Resp: 18 18  Temp: 36.9 C (!) 36.4 C    Last Pain:  Vitals:   11/11/16 1635  TempSrc:   PainSc: 6    Pain Goal: Patients Stated Pain Goal: 3 (11/10/16 0814)               Laban EmperorMalinova,Jayzon Taras Hristova

## 2016-11-12 LAB — CULTURE, GROUP A STREP (THRC)

## 2016-11-12 MED ORDER — ENALAPRIL MALEATE 5 MG PO TABS
5.0000 mg | ORAL_TABLET | Freq: Every day | ORAL | Status: DC
  Filled 2016-11-12 (×2): qty 1

## 2016-11-12 MED ORDER — IBUPROFEN 600 MG PO TABS: 600.0000 mg | ORAL_TABLET | Freq: Four times a day (QID) | ORAL | 0 refills | Status: DC

## 2016-11-12 MED ORDER — RHO D IMMUNE GLOBULIN 1500 UNIT/2ML IJ SOSY
300.0000 ug | PREFILLED_SYRINGE | Freq: Once | INTRAMUSCULAR | Status: AC
  Administered 2016-11-12: 300 ug via INTRAVENOUS
  Filled 2016-11-12: qty 2

## 2016-11-12 NOTE — Discharge Instructions (Signed)
Contraception Choices °Contraception, also called birth control, means things to use or ways to try not to get pregnant. °Hormonal birth control °This kind of birth control uses hormones. Here are some types of hormonal birth control: °· A tube that is put under skin of the arm (implant). The tube can stay in for as long as 3 years. °· Shots to get every 3 months (injections). °· Pills to take every day (birth control pills). °· A patch to change 1 time each week for 3 weeks (birth control patch). After that, the patch is taken off for 1 week. °· A ring to put in the vagina. The ring is left in for 3 weeks. Then it is taken out of the vagina for 1 week. Then a new ring is put in. °· Pills to take after unprotected sex (emergency birth control pills). ° °Barrier birth control °Here are some types of barrier birth control: °· A thin covering that is put on the penis before sex (female condom). The covering is thrown away after sex. °· A soft, loose covering that is put in the vagina before sex (female condom). The covering is thrown away after sex. °· A rubber bowl that sits over the cervix (diaphragm). The bowl must be made for you. The bowl is put into the vagina before sex. The bowl is left in for 6-8 hours after sex. It is taken out within 24 hours. °· A small, soft cup that fits over the cervix (cervical cap). The cup must be made for you. The cup can be left in for 6-8 hours after sex. It is taken out within 48 hours. °· A sponge that is put into the vagina before sex. It must be left in for at least 6 hours after sex. It must be taken out within 30 hours. Then it is thrown away. °· A chemical that kills or stops sperm from getting into the uterus (spermicide). It may be a pill, cream, jelly, or foam to put in the vagina. The chemical should be used at least 10-15 minutes before sex. ° °IUD (intrauterine) birth control °An IUD is a small, T-shaped piece of plastic. It is put inside the uterus. There are two  kinds: °· Hormone IUD. This kind can stay in for 3-5 years. °· Copper IUD. This kind can stay in for 10 years. ° °Permanent birth control °Here are some types of permanent birth control: °· Surgery to block the fallopian tubes. °· Having an insert put into each fallopian tube. °· Surgery to tie off the tubes that carry sperm (vasectomy). ° °Natural planning birth control °Here are some types of natural planning birth control: °· Not having sex on the days the woman could get pregnant. °· Using a calendar: °? To keep track of the length of each period. °? To find out what days pregnancy can happen. °? To plan to not have sex on days when pregnancy can happen. °· Watching for symptoms of ovulation and not having sex during ovulation. One way the woman can check for ovulation is to check her temperature. °· Waiting to have sex until after ovulation. ° °Summary °· Contraception, also called birth control, means things to use or ways to try not to get pregnant. °· Hormonal methods of birth control include implants, injections, pills, patches, vaginal rings, and emergency birth control pills. °· Barrier methods of birth control can include female condoms, female condoms, diaphragms, cervical caps, sponges, and spermicides. °· There are two types of   IUD (intrauterine device) birth control. An IUD can be put in a woman's uterus to prevent pregnancy for 3-5 years. °· Permanent sterilization can be done through a procedure for males, females, or both. °· Natural planning methods involve not having sex on the days when the woman could get pregnant. °This information is not intended to replace advice given to you by your health care provider. Make sure you discuss any questions you have with your health care provider. °Document Released: 02/01/2009 Document Revised: 04/16/2016 Document Reviewed: 04/16/2016 °Elsevier Interactive Patient Education © 2017 [employer] °Home Care Instructions for Mom °ACTIVITY °· Gradually return to  your regular activities. °· Let yourself rest. Nap while your baby sleeps. °· Avoid lifting anything that is heavier than 10 lb (4.5 kg) until your health care provider says it is okay. °· Avoid activities that take a lot of effort and energy (are strenuous) until approved by your health care provider. Walking at a slow-to-moderate pace is usually safe. °· If you had a cesarean delivery: °? Do not vacuum, climb stairs, or drive a car for 4-6 weeks. °? Have someone help you at home until you feel like you can do your usual activities yourself. °? Do exercises as told by your health care provider, if this applies. ° °VAGINAL BLEEDING °You may continue to bleed for 4-6 weeks after delivery. Over time, the amount of blood usually decreases and the color of the blood usually gets lighter. However, the flow of bright red blood may increase if you have been too active. If you need to use more than one pad in an hour because your pad gets soaked, or if you pass a large clot: °· Lie down. °· Raise your feet. °· Place a cold compress on your lower abdomen. °· Rest. °· Call your health care provider. ° °If you are breastfeeding, your period should return anytime between 8 weeks after delivery and the time that you stop breastfeeding. If you are not breastfeeding, your period should return 6-8 weeks after delivery. °PERINEAL CARE °The perineal area, or perineum, is the part of your body between your thighs. After delivery, this area needs special care. Follow these instructions as told by your health care provider. °· Take warm tub baths for 15-20 minutes. °· Use medicated pads and pain-relieving sprays and creams as told. °· Do not use tampons or douches until vaginal bleeding has stopped. °· Each time you go to the bathroom: °? Use a peri bottle. °? Change your pad. °? Use towelettes in place of toilet paper until your stitches have healed. °· Do Kegel exercises every day. Kegel exercises help to maintain the muscles that  support the vagina, bladder, and bowels. You can do these exercises while you are standing, sitting, or lying down. To do Kegel exercises: °? Tighten the muscles of your abdomen and the muscles that surround your birth canal. °? Hold for a few seconds. °? Relax. °? Repeat until you have done this 5 times in a row. °· To prevent hemorrhoids from developing or getting worse: °? Drink enough fluid to keep your urine clear or pale yellow. °? Avoid straining when having a bowel movement. °? Take over-the-counter medicines and stool softeners as told by your health care provider. ° °BREAST CARE °· Wear a tight-fitting bra. °· Avoid taking over-the-counter pain medicine for breast discomfort. °· Apply ice to the breasts to help with discomfort as needed: °? Put ice in a plastic bag. °? Place a towel between your   skin and the bag. °? Leave the ice on for 20 minutes or as told by your health care provider. ° °NUTRITION °· Eat a well-balanced diet. °· Do not try to lose weight quickly by cutting back on calories. °· Take your prenatal vitamins until your postpartum checkup or until your health care provider tells you to stop. ° °POSTPARTUM DEPRESSION °You may find yourself crying for no apparent reason and unable to cope with all of the changes that come with having a newborn. This mood is called postpartum depression. Postpartum depression happens because your hormone levels change after delivery. If you have postpartum depression, get support from your partner, friends, and family. If the depression does not go away on its own after several weeks, contact your health care provider. °BREAST SELF-EXAM °Do a breast self-exam each month, at the same time of the month. If you are breastfeeding, check your breasts just after a feeding, when your breasts are less full. If you are breastfeeding and your period has started, check your breasts on day 5, 6, or 7 of your period. °Report any lumps, bumps, or discharge to your health  care provider. Know that breasts are normally lumpy if you are breastfeeding. This is temporary, and it is not a health risk. °INTIMACY AND SEXUALITY °Avoid sexual activity for at least 3-4 weeks after delivery or until the brownish-red vaginal flow is completely gone. If you want to avoid pregnancy, use some form of birth control. You can get pregnant after delivery, even if you have not had your period. °SEEK MEDICAL CARE IF: °· You feel unable to cope with the changes that a child brings to your life, and these feelings do not go away after several weeks. °· You notice a lump, a bump, or discharge on your breast. ° °SEEK IMMEDIATE MEDICAL CARE IF: °· Blood soaks your pad in 1 hour or less. °· You have: °? Severe pain or cramping in your lower abdomen. °? A bad-smelling vaginal discharge. °? A fever that is not controlled by medicine. °? A fever, and an area of your breast is red and sore. °? Pain or redness in your calf. °? Sudden, severe chest pain. °? Shortness of breath. °? Painful or bloody urination. °? Problems with your vision. °· You vomit for 12 hours or longer. °· You develop a severe headache. °· You have serious thoughts about hurting yourself, your child, or anyone else. ° °This information is not intended to replace advice given to you by your health care provider. Make sure you discuss any questions you have with your health care provider. °Document Released: 04/03/2000 Document Revised: 09/12/2015 Document Reviewed: 10/08/2014 °Elsevier Interactive Patient Education © 2017 [employer] Inc[employer] ° °

## 2016-11-12 NOTE — Progress Notes (Signed)
CSW received consult for hx of marijuana use.  Referral was screened out due to the following: ~MOB had no documented substance use after initial prenatal visit/+UPT. ~MOB had no positive drug screens after initial prenatal visit/+UPT. ~Baby's UDS is negative.  Please consult CSW if current concerns arise or by MOB's request.  CSW will monitor CDS results and make report to Child Protective Services if warranted.  Safa Derner, MSW, LCSW-A Clinical Social Worker  [facility]  Office: [phone]   

## 2016-11-12 NOTE — Plan of Care (Signed)
Problem: Pain Managment: Goal: General experience of comfort will improve Outcome: Completed/Met Date Met: 11/12/16 Patient complaining of back pain. Providing prn pain medication along with scheduled medication. Encourage patient to increase activity in room, ambulate in hallway and sit in chair more than the bed today. Encouraged use of Kpad that had previously been given to patient. Patient states the Kpad makes her too hot so she had to stop using it.

## 2016-11-12 NOTE — Progress Notes (Signed)
Post Partum Day #1 Subjective: no complaints, up ad lib, voiding, tolerating PO and reports normal lochia  Objective: Blood pressure (!) 144/95, pulse 73, temperature 97.8 F (36.6 C), temperature source Oral, resp. rate 18, height 5\' 4"  (1.626 m), weight 89.8 kg (198 lb), last menstrual period 02/19/2016, SpO2 100 %, unknown if currently breastfeeding.  Physical Exam:  General: alert Lochia: appropriate Uterine Fundus: firm and NT at U DVT Evaluation: No evidence of DVT seen on physical exam.   Recent Labs  11/09/16 1703 11/10/16 1654  HGB 12.1 11.5*  HCT 35.9* 34.5*    Assessment/Plan: Plan for discharge tomorrow  Bottle/condoms   LOS: 3 days   Debbie Mccullough 11/12/2016, 6:30 AM

## 2016-11-13 LAB — RH IG WORKUP (INCLUDES ABO/RH)
ABO/RH(D): B NEG
FETAL SCREEN: NEGATIVE
Gestational Age(Wks): 38
UNIT DIVISION: 0

## 2016-11-13 NOTE — Progress Notes (Signed)
Patient very upset that she is only getting ibuprofen for pain on discharge. States this doesn't work for her and needs the stronger medicine for home. Patient's mother cussing and very angry that her daughter can't have the medicine she needs for home. Tried to explain the standard of care for vaginal delivery moms who don't have complications. Grandmother even more angry, cussing more. Notified [place] resident, who said they would not change med orders for discharge. Pt and mother both upset over this.

## 2016-11-13 NOTE — Discharge Summary (Signed)
OB Discharge Summary  Patient Name: Debbie Mccullough DOB: Sep 19, 1995 MRN: [id number]  Date of admission: 11/09/2016 Delivering MD: Jaynie CollinsANYANWU, UGONNA A   Date of discharge: 11/13/2016  Admitting diagnosis: 37wks Preclampsia Intrauterine pregnancy: 7948w0d     Secondary diagnosis:Active Problems:   Pre-eclampsia in third trimester  Additional problems: obesity     Discharge diagnosis: Term Pregnancy Delivered and Preeclampsia (mild)                                                                      Augmentation: AROM, Pitocin, Cytotec and Foley Balloon  Complications: None  Hospital course:  Induction of Labor With Vaginal Delivery   21 y.o. yo G1P1001 at 348w0d was admitted to the hospital 11/09/2016 for induction of labor.  Indication for induction: Preeclampsia.  Patient had an uncomplicated labor course as follows: Membrane Rupture Time/Date: 5:52 PM ,11/10/2016   Intrapartum Procedures: Episiotomy: None [1]                                         Lacerations:  2nd degree [3]  Patient had delivery of a Viable infant.  Information for the patient's newborn:  Jose Persianthony, Girl Doylene [[id number]][[id number]]  Delivery Method: Vaginal, Spontaneous Delivery (Filed from Delivery Summary)   11/11/2016  Details of delivery can be found in separate delivery note.  Patient had a routine postpartum course. Patient is discharged home 11/13/16.  Physical exam  Vitals:   11/12/16 1002 11/12/16 1823 11/13/16 0000 11/13/16 0604  BP: 126/63 133/75 135/74 129/65  Pulse: 60 65 65 72  Resp:  18  18  Temp:  98.3 F (36.8 C)    TempSrc:  Oral    SpO2:      Weight:      Height:       General: alert Lochia: appropriate Uterine Fundus: firm Incision: N/A DVT Evaluation: No evidence of DVT seen on physical exam. Labs: Lab Results  Component Value Date   WBC 20.7 (H) 11/10/2016   HGB 11.5 (L) 11/10/2016   HCT 34.5 (L) 11/10/2016   MCV 87.6 11/10/2016   PLT 285 11/10/2016   CMP Latest Ref Rng &  Units 11/09/2016  Glucose 65 - 99 mg/dL 782(N115(H)  BUN 6 - 20 mg/dL <5(A<5(L)  Creatinine 2.130.44 - 1.00 mg/dL 0.860.50  Sodium 578135 - 469145 mmol/L 134(L)  Potassium 3.5 - 5.1 mmol/L 4.2  Chloride 101 - 111 mmol/L 104  CO2 22 - 32 mmol/L 23  Calcium 8.9 - 10.3 mg/dL 9.2  Total Protein 6.5 - 8.1 g/dL 6.8  Total Bilirubin 0.3 - 1.2 mg/dL 0.5  Alkaline Phos 38 - 126 U/L 162(H)  AST 15 - 41 U/L 16  ALT 14 - 54 U/L 10(L)    Discharge instruction: per After Visit Summary and "Baby and Me Booklet".  After Visit Meds:  Allergies as of 11/13/2016   No Known Allergies     Medication List    STOP taking these medications   ACCU-CHEK FASTCLIX LANCETS Misc   diphenhydrAMINE 25 mg capsule Commonly known as:  BENADRYL   doxylamine (Sleep) 25 MG tablet Commonly known as:  Dartha LodgeUNISOM  glucose blood test strip Commonly known as:  ACCU-CHEK GUIDE     TAKE these medications   ibuprofen 600 MG tablet Commonly known as:  ADVIL,MOTRIN Take 1 tablet (600 mg total) by mouth every 6 (six) hours.   PREPLUS 27-1 MG Tabs Take 1 tablet by mouth daily.   ranitidine 150 MG capsule Commonly known as:  ZANTAC Take 1 capsule (150 mg total) by mouth 2 (two) times daily.       Diet: routine diet  Activity: Advance as tolerated. Pelvic rest for 6 weeks.   Outpatient follow up:4 weeks Follow up Appt:No future appointments. Follow up visit: No Follow-up on file.  Postpartum contraception: Condoms  Newborn Data: Live born female  Birth Weight: 6 lb 12.8 oz (3084 g) APGAR: 8, 9  Baby Feeding: Bottle Disposition:home with mother   11/13/2016 Allie BossierMyra C Mia Milan, MD

## 2016-11-19 ENCOUNTER — Ambulatory Visit (HOSPITAL_COMMUNITY): Payer: Medicaid Other

## 2016-12-23 ENCOUNTER — Ambulatory Visit: Payer: Medicaid Other | Admitting: Advanced Practice Midwife

## 2016-12-23 ENCOUNTER — Encounter: Payer: Self-pay | Admitting: Advanced Practice Midwife

## 2018-11-25 IMAGING — US US MFM OB COMP +14 WKS
1 series · 13 of 28 positions shown · non-contrast
Comparison: none

[Series 1: us mfm ob comp +14 wks · 13 of 118 slices shown]
[im 5/118]
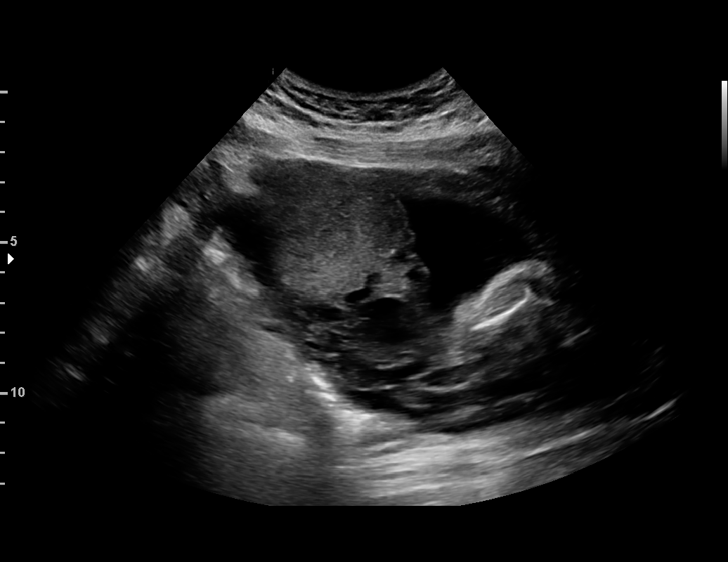
[im 14/118]
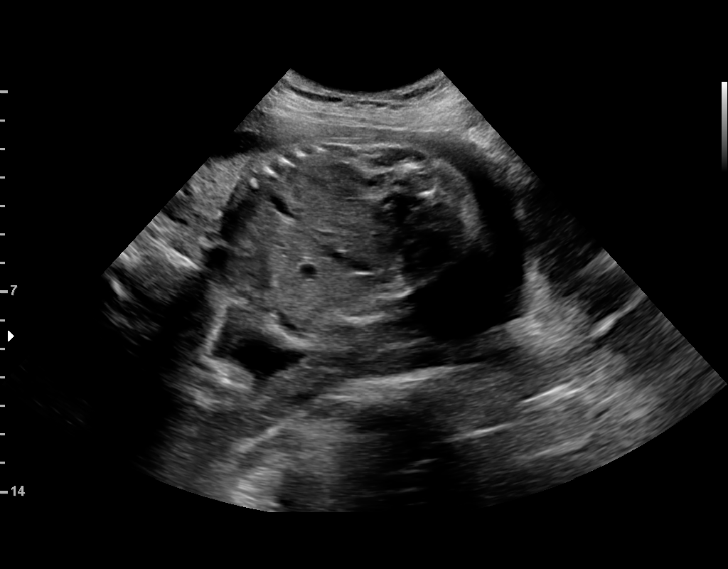
[im 22/118]
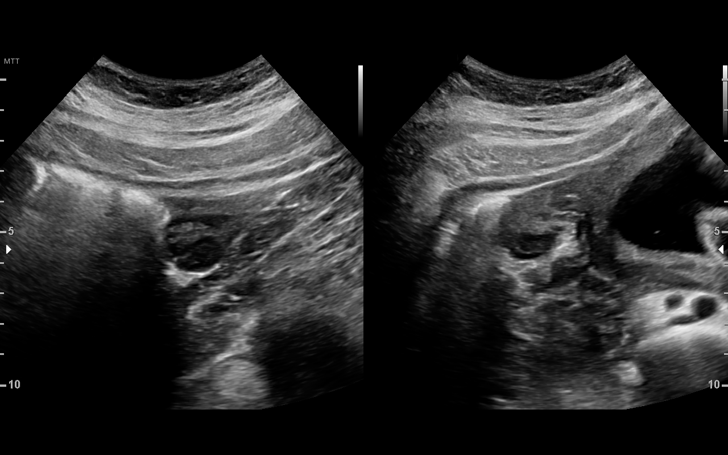
[im 31/118]
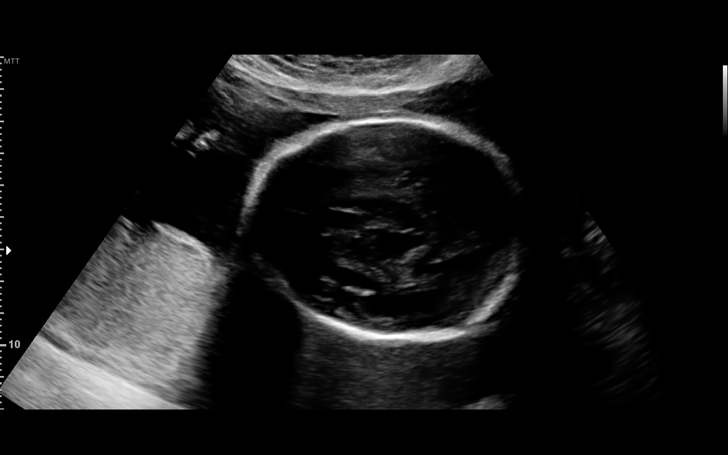
[im 40/118]
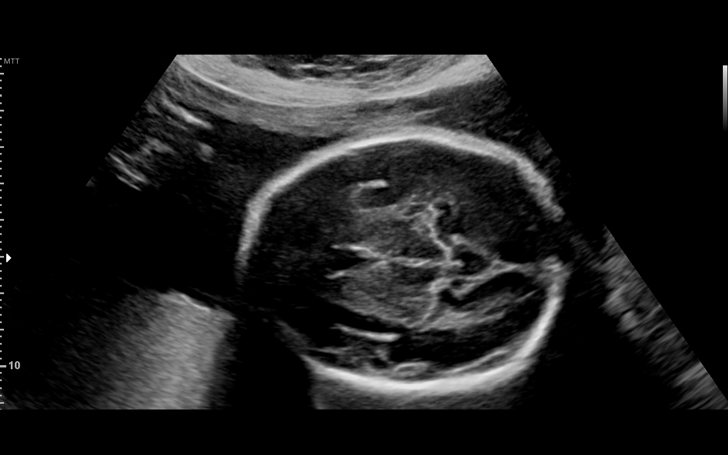
[im 48/118]
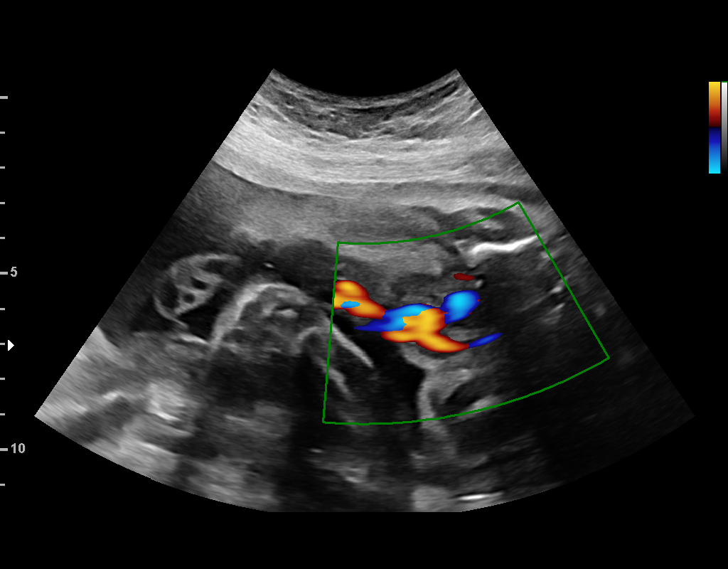
[im 61/118]
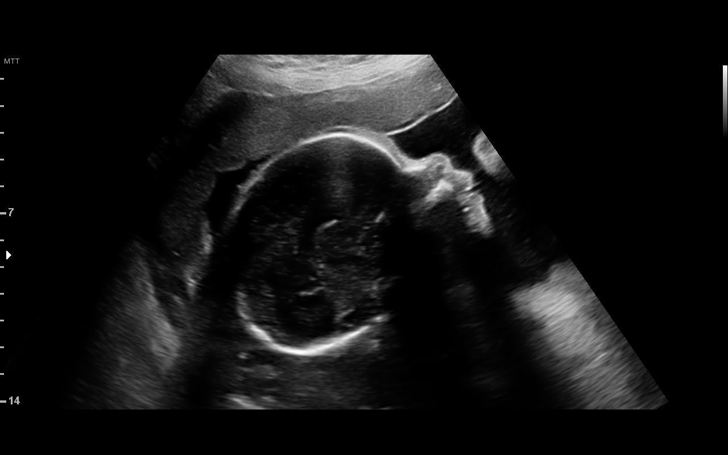
[im 70/118]
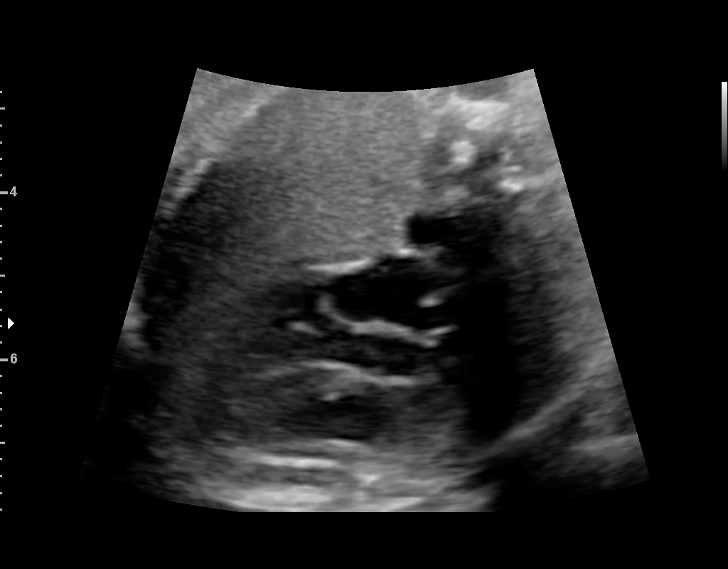
[im 79/118]
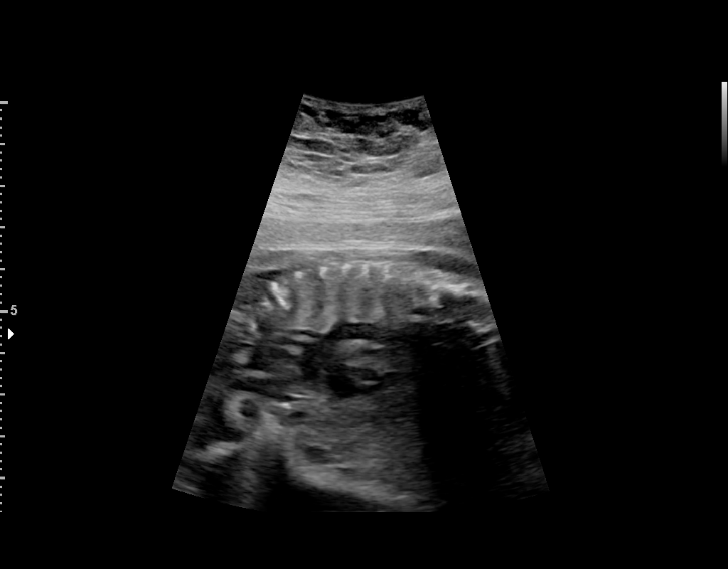
[im 87/118]
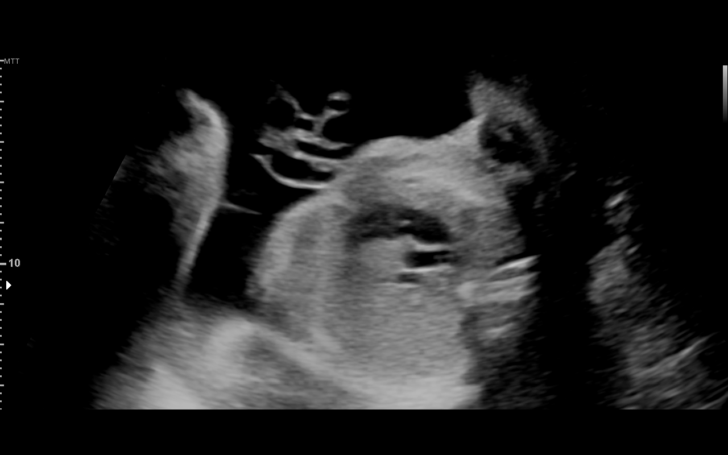
[im 96/118]
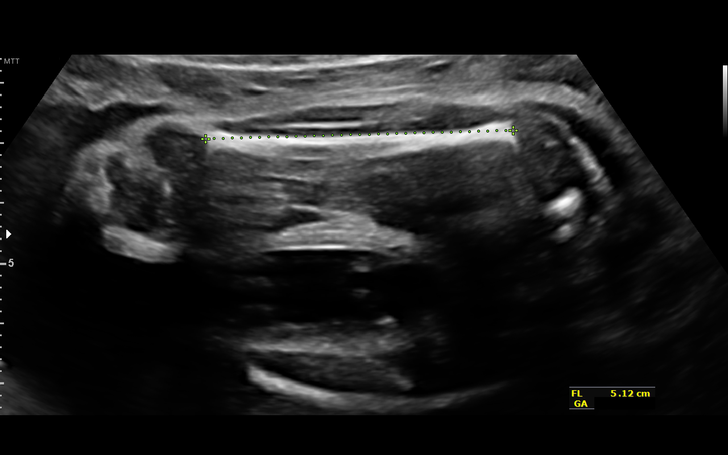
[im 105/118]
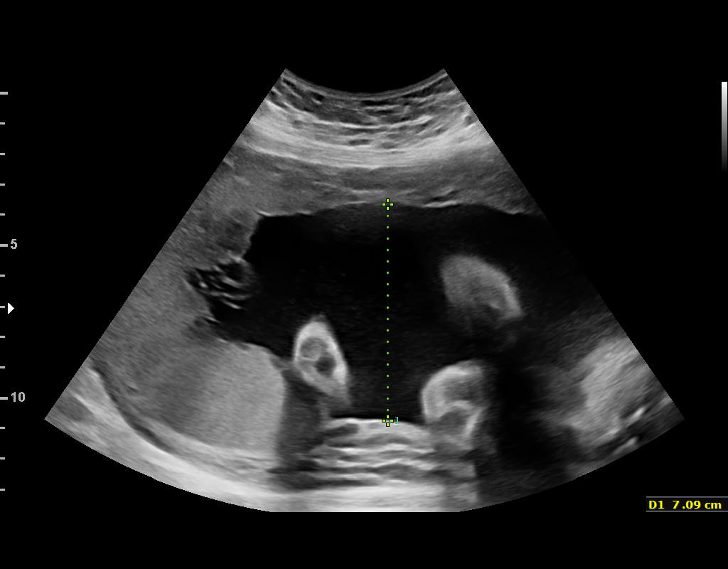
[im 113/118]
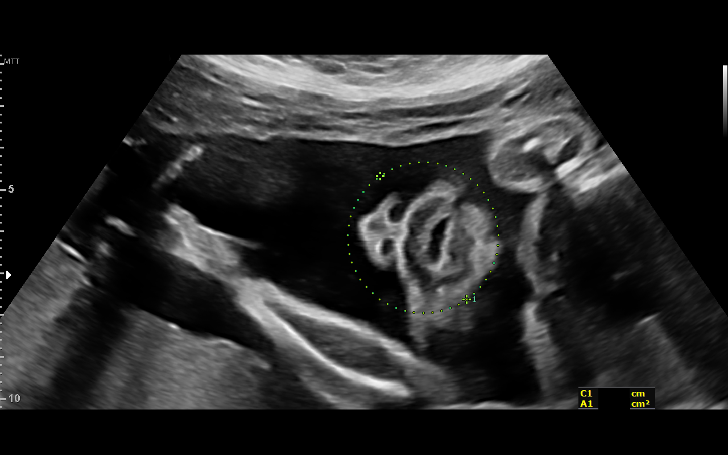

[13 of 28 positions shown; findings below may reference images not displayed]

OB/Gyn Clinic
[REDACTED]

1  TIMRO YAD BODA           [id number]      [id number]     [id number]
Indications

27 weeks gestation of pregnancy
Encounter for uncertain dates
Antenatal screening for malformations
Late prenatal care, second trimester
Tobacco use complicating pregnancy,
second trimester
OB History

Blood Type:            Height:  5'4"   Weight (lb):  180       BMI:
Gravidity:    1         Term:   0        Prem:   0        SAB:   0
TOP:          0       Ectopic:  0        Living: 0
Fetal Evaluation

Num Of Fetuses:     1
Fetal Heart         168
Rate(bpm):
Cardiac Activity:   Observed
Presentation:       Breech
Placenta:           Fundal, above cervical os
P. Cord Insertion:  Visualized, central
Amniotic Fluid
AFI FV:      Subjectively within normal limits

Largest Pocket(cm)
7.1
Biometry

BPD:      67.8  mm     G. Age:  27w 2d         29  %    CI:        73.28   %    70 - 86
FL/HC:      20.3   %    18.8 -
HC:      251.7  mm     G. Age:  27w 3d         17  %    HC/AC:      1.05        1.05 -
AC:      240.8  mm     G. Age:  28w 3d         66  %    FL/BPD:     75.2   %    71 - 87
FL:         51  mm     G. Age:  27w 2d         28  %    FL/AC:      21.2   %    20 - 24
HUM:      45.8  mm     G. Age:  27w 0d         35  %
CER:      32.9  mm     G. Age:  28w 6d         67  %

CM:        3.1  mm

Est. FW:    3314  gm      2 lb 8 oz     57  %
Gestational Age

LMP:           27w 5d        Date:  02/19/16                 EDD:   11/25/16
U/S Today:     27w 4d                                        EDD:   11/26/16
Best:          27w 4d     Det. By:  U/S (08/31/16)           EDD:   11/26/16
Anatomy

Cranium:               Appears normal         LVOT:                   Appears normal
Cavum:                 Appears normal         Aortic Arch:            Appears normal
Ventricles:            Appears normal         Ductal Arch:            Not well visualized
Choroid Plexus:        Appears normal         Diaphragm:              Appears normal
Cerebellum:            Appears normal         Stomach:                Appears normal, left
sided
Posterior Fossa:       Not well visualized    Abdomen:                Appears normal
Nuchal Fold:           Not applicable (>20    Abdominal Wall:         Appears nml (cord
wks GA)                                        insert, abd wall)
Face:                  Appears normal         Cord Vessels:           Appears normal (3
(orbits and profile)                           vessel cord)
Lips:                  Appears normal         Kidneys:                Appear normal
Thoracic:              Appears normal         Bladder:                Appears normal
Heart:                 Appears normal         Spine:                  Limited views
(4CH, axis, and situs                          appear normal
RVOT:                  Not well visualized

Other:  Fetus appears to be a female. Heels visualized. Technically difficult
due to advanced GA and fetal position.
Cervix Uterus Adnexa

Cervix
Length:            3.6  cm.
Normal appearance by transabdominal scan.

Uterus
No abnormality visualized.

Left Ovary
Size(cm)     2.65   x   1.98   x  1.02      Vol(ml):
Within normal limits. No adnexal mass visualized.
Right Ovary
Size(cm)     2.48   x   2.16   x  1.86      Vol(ml):
Within normal limits. No adnexal mass visualized.

Cul De Sac:   No free fluid seen.

Adnexa:       No abnormality visualized.
Impression

Singleton intrauterine pregnancy at 27+5 weeks by uncertain
dates. Here to confirm dates and anatomic survey
Review of the anatomy shows no sonographic markers for
aneuploidy or structural anomalies
However, evaluations should be considered suboptimal
secondary to late EGA and fetal position
Amniotic fluid volume is normal
Biometry confirms menstrual dating
Estimated fetal weight is 1134g which is growth in the 57th
percentile
Recommendations

Consider repeat scan in 4-6 weeks to confirm normal growth

## 2020-04-22 ENCOUNTER — Other Ambulatory Visit: Payer: Self-pay

## 2020-04-22 DIAGNOSIS — E119 Type 2 diabetes mellitus without complications: Secondary | ICD-10-CM | POA: Insufficient documentation

## 2020-04-22 DIAGNOSIS — R059 Cough, unspecified: Secondary | ICD-10-CM | POA: Diagnosis present

## 2020-04-22 DIAGNOSIS — Z87891 Personal history of nicotine dependence: Secondary | ICD-10-CM | POA: Diagnosis not present

## 2020-04-22 DIAGNOSIS — U071 COVID-19: Secondary | ICD-10-CM | POA: Diagnosis not present

## 2020-04-23 ENCOUNTER — Encounter (HOSPITAL_COMMUNITY): Payer: Self-pay

## 2020-04-23 ENCOUNTER — Emergency Department (HOSPITAL_COMMUNITY)
Admission: EM | Admit: 2020-04-23 | Discharge: 2020-04-23 | Discharge status: Against Medical Advice | Payer: Medicaid Other | Attending: Emergency Medicine | Admitting: Emergency Medicine

## 2020-04-23 ENCOUNTER — Other Ambulatory Visit: Payer: Self-pay

## 2020-04-23 DIAGNOSIS — U071 COVID-19: Secondary | ICD-10-CM

## 2020-04-23 LAB — RESP PANEL BY RT-PCR (FLU A&B, COVID) ARPGX2
Influenza A by PCR: NEGATIVE
Influenza B by PCR: NEGATIVE
SARS Coronavirus 2 by RT PCR: POSITIVE — AB

## 2020-04-23 NOTE — ED Notes (Signed)
Pt called X4. No answer for triage.

## 2020-04-23 NOTE — Discharge Instructions (Signed)
Make sure you are taking Tylenol and ibuprofen to help with pain, body aches or fever. Make sure you are drinking plenty of fluids. Return to the ER if you start to experience chest pain, shortness of breath, uncontrollable vomiting.

## 2020-04-23 NOTE — ED Provider Notes (Signed)
[facility] EMERGENCY DEPARTMENT Provider Note   CSN: [id number] Arrival date & time: 04/22/20  2321     History Chief Complaint  Patient presents with  . URI    Debbie Mccullough is a 25 y.o. female with a past medical history of diabetes presenting to the ED with a chief complaint of cough, body aches and chills.  Symptoms began 2 days ago.  Sick contacts including her daughter with similar symptoms who tested negative for Covid.  She has been taking over-the-counter medication such as TheraFlu with minimal improvement in her symptoms.  She denies any vomiting, diarrhea, chest pain, shortness of breath, productive cough or chronic lung disease.  She does smoke.  HPI     Past Medical History:  Diagnosis Date  . Bronchitis   . Diabetes mellitus without complication (HCC)   . Gestational diabetes   . Medical history non-contributory   . Pyelonephritis     Patient Active Problem List   Diagnosis Date Noted  . Pre-eclampsia in third trimester 11/09/2016  . Marijuana abuse 11/02/2016  . Gestational diabetes mellitus (GDM) affecting first pregnancy 09/17/2016  . Supervision of high risk pregnancy, antepartum, third trimester 09/03/2016  . Smoker 09/03/2016  . GBS (group b Streptococcus) UTI complicating pregnancy, second trimester 08/20/2016  . Pyelonephritis affecting pregnancy in second trimester 08/17/2016    Past Surgical History:  Procedure Laterality Date  . MULTIPLE TOOTH EXTRACTIONS       OB History    Gravida  1   Para  1   Term  1   Preterm      AB      Living  1     SAB      IAB      Ectopic      Multiple  0   Live Births  1           Family History  Problem Relation Age of Onset  . Birth defects Maternal Grandfather   . Fibroids Mother   . Diabetes Maternal Grandmother     Social History   Tobacco Use  . Smoking status: Former Smoker    Packs/day: 0.25    Years: 5.00    Pack years: 1.25    Types: Cigars     Quit date: 08/17/2016    Years since quitting: 3.6  . Smokeless tobacco: Never Used  . Tobacco comment: Stopped on her own at + UPT  Substance Use Topics  . Alcohol use: No    Alcohol/week: 3.0 standard drinks    Types: 3 Cans of beer per week    Comment: occasionally before pregnancy  . Drug use: No    Types: Marijuana    Comment: none since April    Home Medications Prior to Admission medications   Medication Sig Start Date End Date Taking? Authorizing Provider  ibuprofen (ADVIL,MOTRIN) 600 MG tablet Take 1 tablet (600 mg total) by mouth every 6 (six) hours. 11/12/16   Allie Bossier, MD  Prenatal Vit-Fe Fumarate-FA (PREPLUS) 27-1 MG TABS Take 1 tablet by mouth daily. 08/19/16   Anyanwu, Jethro Bastos, MD  ranitidine (ZANTAC) 150 MG capsule Take 1 capsule (150 mg total) by mouth 2 (two) times daily. 11/02/16   Sehili Bing, MD    Allergies    Patient has no known allergies.  Review of Systems   Review of Systems  Constitutional: Positive for appetite change and chills. Negative for fever.  HENT: Positive for sinus pressure. Negative for  congestion, sinus pain and trouble swallowing.   Respiratory: Positive for cough. Negative for shortness of breath.   Gastrointestinal: Negative for diarrhea and vomiting.  Musculoskeletal: Positive for myalgias.    Physical Exam Updated Vital Signs BP (!) 146/78 (BP Location: Right Arm)   Pulse 67   Temp 98.4 F (36.9 C) (Oral)   Resp 17   Ht 5\' 4"  (1.626 m)   Wt 84.8 kg   SpO2 100%   BMI 32.10 kg/m   Physical Exam Vitals and nursing note reviewed.  Constitutional:      General: She is not in acute distress.    Appearance: She is well-developed and well-nourished. She is not diaphoretic.  HENT:     Head: Normocephalic and atraumatic.     Mouth/Throat:     Pharynx: Oropharynx is clear.  Eyes:     General: No scleral icterus.    Extraocular Movements: EOM normal.     Conjunctiva/sclera: Conjunctivae normal.  Cardiovascular:      Rate and Rhythm: Normal rate and regular rhythm.     Heart sounds: Normal heart sounds.  Pulmonary:     Effort: Pulmonary effort is normal. No respiratory distress.     Breath sounds: Normal breath sounds.  Abdominal:     Tenderness: There is no abdominal tenderness.  Musculoskeletal:     Cervical back: Normal range of motion.  Skin:    Findings: No rash.  Neurological:     Mental Status: She is alert.  Psychiatric:        Mood and Affect: Mood and affect normal.     ED Results / Procedures / Treatments   Labs (all labs ordered are listed, but only abnormal results are displayed) Labs Reviewed  RESP PANEL BY RT-PCR (FLU A&B, COVID) ARPGX2 - Abnormal; Notable for the following components:      Result Value   SARS Coronavirus 2 by RT PCR POSITIVE (*)    All other components within normal limits    EKG None  Radiology No results found.  Procedures Procedures (including critical care time)  Medications Ordered in ED Medications - No data to display  ED Course  I have reviewed the triage vital signs and the nursing notes.  Pertinent labs & imaging results that were available during my care of the patient were reviewed by me and considered in my medical decision making (see chart for details).    MDM Rules/Calculators/A&P                          Debbie Mccullough was evaluated in Emergency Department on 04/23/20 for the symptoms described in the history of present illness. He/she was evaluated in the context of the global COVID-19 pandemic, which necessitated consideration that the patient might be at risk for infection with the SARS-CoV-2 virus that causes COVID-19. Institutional protocols and algorithms that pertain to the evaluation of patients at risk for COVID-19 are in a state of rapid change based on information released by regulatory bodies including the CDC and federal and state organizations. These policies and algorithms were followed during the patient's care in  the ED.  25 year old female presenting to the ED for 2-day history of cough, chills, sinus pressure and myalgias.  Minimal improvement noted with over-the-counter medications.  Sick contacts including her daughter with similar symptoms.  Denies any chest pain, shortness of breath, vomiting or diarrhea.  Lungs are clear to auscultation bilaterally.  There is a slight cough noted  on my exam.  Oxygen saturations are 99-100% on room air.  Covid test positive here.  She does not meet criteria for admission as she is not hypoxic and is experiencing mild symptoms.  She will be given information regarding how to manage her symptoms at home and follow-up at [facility] care clinic. Return precautions given.   Patient is hemodynamically stable, in NAD, and able to ambulate in the ED. Evaluation does not show pathology that would require ongoing emergent intervention or inpatient treatment. I explained the diagnosis to the patient. Pain has been managed and has no complaints prior to discharge. Patient is comfortable with above plan and is stable for discharge at this time. All questions were answered prior to disposition. Strict return precautions for returning to the ED were discussed. Encouraged follow up with PCP.   An After Visit Summary was printed and given to the patient.   Portions of this note were generated with Scientist, clinical (histocompatibility and immunogenetics). Dictation errors may occur despite best attempts at proofreading.  Final Clinical Impression(s) / ED Diagnoses Final diagnoses:  COVID-19 virus infection    Rx / DC Orders ED Discharge Orders    None       Dietrich Pates, PA-C 04/23/20 0915    Maia Plan, MD 04/23/20 1141

## 2020-04-23 NOTE — ED Triage Notes (Signed)
Patient reports cold like symptoms x a few days, thinks she had a fever, runny nose, and cough. Reports her daughter was sick with a cold but tested negative for Covid.

## 2020-05-15 ENCOUNTER — Telehealth (INDEPENDENT_AMBULATORY_CARE_PROVIDER_SITE_OTHER): Payer: Medicaid Other | Admitting: Primary Care

## 2020-09-06 ENCOUNTER — Emergency Department (HOSPITAL_COMMUNITY): Payer: Medicaid Other

## 2020-09-06 ENCOUNTER — Encounter (HOSPITAL_COMMUNITY): Payer: Self-pay

## 2020-09-06 ENCOUNTER — Other Ambulatory Visit: Payer: Self-pay

## 2020-09-06 ENCOUNTER — Encounter (HOSPITAL_COMMUNITY): Payer: Self-pay | Admitting: *Deleted

## 2020-09-06 ENCOUNTER — Emergency Department (HOSPITAL_COMMUNITY)
Admission: EM | Admit: 2020-09-06 | Discharge: 2020-09-06 | Disposition: A | Discharge status: Home / Self Care | Payer: Medicaid Other | Attending: Emergency Medicine | Admitting: Emergency Medicine

## 2020-09-06 ENCOUNTER — Ambulatory Visit (HOSPITAL_COMMUNITY)
Admission: EM | Admit: 2020-09-06 | Discharge: 2020-09-06 | Disposition: A | Discharge status: Home / Self Care | Payer: Medicaid Other | Attending: Internal Medicine | Admitting: Internal Medicine

## 2020-09-06 DIAGNOSIS — Z87891 Personal history of nicotine dependence: Secondary | ICD-10-CM | POA: Insufficient documentation

## 2020-09-06 DIAGNOSIS — R109 Unspecified abdominal pain: Secondary | ICD-10-CM | POA: Diagnosis present

## 2020-09-06 DIAGNOSIS — R1033 Periumbilical pain: Secondary | ICD-10-CM

## 2020-09-06 DIAGNOSIS — M545 Low back pain, unspecified: Secondary | ICD-10-CM | POA: Insufficient documentation

## 2020-09-06 DIAGNOSIS — R110 Nausea: Secondary | ICD-10-CM | POA: Insufficient documentation

## 2020-09-06 DIAGNOSIS — M791 Myalgia, unspecified site: Secondary | ICD-10-CM | POA: Insufficient documentation

## 2020-09-06 DIAGNOSIS — E119 Type 2 diabetes mellitus without complications: Secondary | ICD-10-CM | POA: Insufficient documentation

## 2020-09-06 DIAGNOSIS — K439 Ventral hernia without obstruction or gangrene: Secondary | ICD-10-CM | POA: Diagnosis not present

## 2020-09-06 DIAGNOSIS — M7601 Gluteal tendinitis, right hip: Secondary | ICD-10-CM | POA: Insufficient documentation

## 2020-09-06 DIAGNOSIS — M7918 Myalgia, other site: Secondary | ICD-10-CM

## 2020-09-06 LAB — CBC WITH DIFFERENTIAL/PLATELET
Abs Immature Granulocytes: 0.03 10*3/uL (ref 0.00–0.07)
Basophils Absolute: 0.1 10*3/uL (ref 0.0–0.1)
Basophils Relative: 1 %
Eosinophils Absolute: 0.1 10*3/uL (ref 0.0–0.5)
Eosinophils Relative: 1 %
HCT: 43.6 % (ref 36.0–46.0)
Hemoglobin: 13.7 g/dL (ref 12.0–15.0)
Immature Granulocytes: 0 %
Lymphocytes Relative: 32 %
Lymphs Abs: 3.4 10*3/uL (ref 0.7–4.0)
MCH: 30.1 pg (ref 26.0–34.0)
MCHC: 31.4 g/dL (ref 30.0–36.0)
MCV: 95.8 fL (ref 80.0–100.0)
Monocytes Absolute: 0.7 10*3/uL (ref 0.1–1.0)
Monocytes Relative: 7 %
Neutro Abs: 6.3 10*3/uL (ref 1.7–7.7)
Neutrophils Relative %: 59 %
Platelets: 310 10*3/uL (ref 150–400)
RBC: 4.55 MIL/uL (ref 3.87–5.11)
RDW: 12.9 % (ref 11.5–15.5)
WBC: 10.5 10*3/uL (ref 4.0–10.5)
nRBC: 0 % (ref 0.0–0.2)

## 2020-09-06 LAB — COMPREHENSIVE METABOLIC PANEL
ALT: 13 U/L (ref 0–44)
AST: 15 U/L (ref 15–41)
Albumin: 3.9 g/dL (ref 3.5–5.0)
Alkaline Phosphatase: 46 U/L (ref 38–126)
Anion gap: 6 (ref 5–15)
BUN: <5 mg/dL — ABNORMAL LOW (ref 6–20)
CO2: 28 mmol/L (ref 22–32)
Calcium: 9.2 mg/dL (ref 8.9–10.3)
Chloride: 103 mmol/L (ref 98–111)
Creatinine, Ser: 0.73 mg/dL (ref 0.44–1.00)
GFR, Estimated: >60 mL/min (ref >60)
Glucose, Bld: 99 mg/dL (ref 70–99)
Potassium: 5 mmol/L (ref 3.5–5.1)
Sodium: 137 mmol/L (ref 135–145)
Total Bilirubin: 0.5 mg/dL (ref 0.3–1.2)
Total Protein: 7.1 g/dL (ref 6.5–8.1)

## 2020-09-06 LAB — POCT URINALYSIS DIPSTICK, ED / UC
Bilirubin Urine: NEGATIVE
Glucose, UA: NEGATIVE mg/dL
Hgb urine dipstick: NEGATIVE
Ketones, ur: NEGATIVE mg/dL
Nitrite: NEGATIVE
Protein, ur: NEGATIVE mg/dL
Specific Gravity, Urine: 1.015 (ref 1.005–1.030)
Urobilinogen, UA: 0.2 mg/dL (ref 0.0–1.0)
pH: 6.5 (ref 5.0–8.0)

## 2020-09-06 LAB — LIPASE, BLOOD: Lipase: 22 U/L (ref 11–51)

## 2020-09-06 LAB — POC URINE PREG, ED: Preg Test, Ur: NEGATIVE

## 2020-09-06 MED ORDER — KETOROLAC TROMETHAMINE 30 MG/ML IJ SOLN
30.0000 mg | Freq: Once | INTRAMUSCULAR | Status: AC
  Administered 2020-09-06: 30 mg via INTRAVENOUS
  Filled 2020-09-06: qty 1

## 2020-09-06 MED ORDER — FENTANYL CITRATE (PF) 100 MCG/2ML IJ SOLN
50.0000 ug | Freq: Once | INTRAMUSCULAR | Status: AC
Start: 2020-09-06 — End: 2020-09-06
  Administered 2020-09-06: 50 ug via INTRAVENOUS
  Filled 2020-09-06: qty 2

## 2020-09-06 MED ORDER — METHOCARBAMOL 500 MG PO TABS: 500.0000 mg | ORAL_TABLET | Freq: Three times a day (TID) | ORAL | 0 refills | Status: DC | PRN

## 2020-09-06 NOTE — ED Notes (Addendum)
Pt reports increase in abdominal discomfort as well as pain/pressure in lower back. Pt reports that her last bowel movement was "a few days ago."

## 2020-09-06 NOTE — ED Notes (Signed)
Patient transported to CT 

## 2020-09-06 NOTE — ED Triage Notes (Signed)
Pt came from urgent care where they suggested the pt to come to the ED for further evaluation of abd pain and lower pain that has been going on for 1 week. Pt is A&O *4. Pt denies N/V.

## 2020-09-06 NOTE — ED Triage Notes (Signed)
PT reports lower ABD pain and Lower back pain . Pain started yesterday.

## 2020-09-06 NOTE — ED Provider Notes (Signed)
[facility] EMERGENCY DEPARTMENT Provider Note   CSN: [id number] Arrival date & time: 09/06/20  1357     History Chief Complaint  Patient presents with  . Abdominal Pain    Debbie Mccullough is a 25 y.o. female.  HPI Patient presents with abdominal pain.  Has known abdominal hernia.  States that hernias been more painful last couple days.  Seen at urgent care and sent here.  Also pain in her lower back.  States it is in the lower back into the right buttock.  No dysuria.  No weakness.  States she has had some nausea without vomiting.  Last bowel movement a couple days ago.  No previous abdominal surgery.  No fevers or chills.    Past Medical History:  Diagnosis Date  . Bronchitis   . Diabetes mellitus without complication (HCC)   . Gestational diabetes   . Medical history non-contributory   . Pyelonephritis     Patient Active Problem List   Diagnosis Date Noted  . Pre-eclampsia in third trimester 11/09/2016  . Marijuana abuse 11/02/2016  . Gestational diabetes mellitus (GDM) affecting first pregnancy 09/17/2016  . Supervision of high risk pregnancy, antepartum, third trimester 09/03/2016  . Smoker 09/03/2016  . GBS (group b Streptococcus) UTI complicating pregnancy, second trimester 08/20/2016  . Pyelonephritis affecting pregnancy in second trimester 08/17/2016    Past Surgical History:  Procedure Laterality Date  . MULTIPLE TOOTH EXTRACTIONS       OB History    Gravida  1   Para  1   Term  1   Preterm      AB      Living  1     SAB      IAB      Ectopic      Multiple  0   Live Births  1           Family History  Problem Relation Age of Onset  . Birth defects Maternal Grandfather   . Fibroids Mother   . Diabetes Maternal Grandmother     Social History   Tobacco Use  . Smoking status: Former Smoker    Packs/day: 0.25    Years: 5.00    Pack years: 1.25    Types: Cigars    Quit date: 08/17/2016    Years since  quitting: 4.0  . Smokeless tobacco: Never Used  . Tobacco comment: Stopped on her own at + UPT  Substance Use Topics  . Alcohol use: No    Alcohol/week: 3.0 standard drinks    Types: 3 Cans of beer per week    Comment: occasionally before pregnancy  . Drug use: No    Types: Marijuana    Comment: none since April    Home Medications Prior to Admission medications   Medication Sig Start Date End Date Taking? Authorizing Provider  methocarbamol (ROBAXIN) 500 MG tablet Take 1 tablet (500 mg total) by mouth every 8 (eight) hours as needed for muscle spasms. 09/06/20  Yes Benjiman Core, MD  ibuprofen (ADVIL,MOTRIN) 600 MG tablet Take 1 tablet (600 mg total) by mouth every 6 (six) hours. 11/12/16   Allie Bossier, MD  Prenatal Vit-Fe Fumarate-FA (PREPLUS) 27-1 MG TABS Take 1 tablet by mouth daily. 08/19/16   Anyanwu, Jethro Bastos, MD  ranitidine (ZANTAC) 150 MG capsule Take 1 capsule (150 mg total) by mouth 2 (two) times daily. 11/02/16   [place] Bing, MD    Allergies    Patient has  no known allergies.  Review of Systems   Review of Systems  Constitutional: Positive for appetite change. Negative for fever.  HENT: Negative for congestion.   Respiratory: Negative for shortness of breath.   Cardiovascular: Negative for chest pain.  Gastrointestinal: Positive for abdominal pain and nausea.  Genitourinary: Negative for flank pain.  Musculoskeletal: Positive for back pain.  Skin: Negative for rash.  Neurological: Negative for weakness and light-headedness.  Psychiatric/Behavioral: Negative for agitation.    Physical Exam Updated Vital Signs BP 125/80 (BP Location: Right Arm)   Pulse (!) 55   Temp 98.8 F (37.1 C) (Oral)   Resp 16   Ht 5\' 3"  (1.6 m)   Wt 83.9 kg   LMP 08/19/2020   SpO2 100%   BMI 32.77 kg/m   Physical Exam Vitals and nursing note reviewed.  HENT:     Head: Normocephalic.  Cardiovascular:     Rate and Rhythm: Normal rate and regular rhythm.  Pulmonary:      Breath sounds: Normal breath sounds.  Abdominal:     Tenderness: There is abdominal tenderness.     Hernia: A hernia is present.     Comments: Periumbilical hernia.  Some tenderness but reducible.  No distention.  Genitourinary:    Comments: Tenderness over right buttock area.  No skin changes.  No pain with straight leg raise. Skin:    General: Skin is warm.     Capillary Refill: Capillary refill takes less than 2 seconds.  Neurological:     Mental Status: She is alert and oriented to person, place, and time.  Psychiatric:        Mood and Affect: Mood normal.     ED Results / Procedures / Treatments   Labs (all labs ordered are listed, but only abnormal results are displayed) Labs Reviewed  COMPREHENSIVE METABOLIC PANEL - Abnormal; Notable for the following components:      Result Value   BUN <5 (*)    All other components within normal limits  CBC WITH DIFFERENTIAL/PLATELET  LIPASE, BLOOD    EKG None  Radiology CT ABDOMEN PELVIS WO CONTRAST  Result Date: 09/06/2020 CLINICAL DATA:  Epigastric and umbilical pain.  Hernia suspected. EXAM: CT ABDOMEN AND PELVIS WITHOUT CONTRAST TECHNIQUE: Multidetector CT imaging of the abdomen and pelvis was performed following the standard protocol without IV contrast. COMPARISON:  None. FINDINGS: Lower chest: Minor right basilar atelectasis. Lung bases otherwise clear. Hepatobiliary: No focal liver abnormality is seen on this noncontrast exam. Unremarkable gallbladder without calcified gallstone or pericholecystic inflammation. Common bile duct is not well-defined, but is not abnormally distended. Pancreas: Unremarkable. No pancreatic ductal dilatation or surrounding inflammatory changes. Spleen: Normal in size without focal abnormality. Adrenals/Urinary Tract: No adrenal nodule. No hydronephrosis or renal calculi. No evidence of focal renal lesion. Physiologically distended bladder without wall thickening or stone. Stomach/Bowel: Bowel assessment  limited on this noncontrast exam. Ingested material within the stomach. Unremarkable small bowel. Normal appendix. Small to moderate colonic stool burden. No colonic wall thickening or inflammation. Vascular/Lymphatic: Abdominal aorta is normal in caliber. No bulky abdominopelvic adenopathy on this noncontrast exam Reproductive: Uterus and bilateral adnexa are unremarkable. Trace free fluid in the dependent pelvis is typically physiologic. Other: Small supraumbilical ventral abdominal wall hernia. There is mild inflammation at the base of the hernia neck, which is difficult to accurately measure, series 7, image 118. No bowel involvement. Trace free fluid in the dependent pelvis typically physiologic. No upper abdominal ascites. No inguinal or significant umbilical  hernia. Musculoskeletal: There are no acute or suspicious osseous abnormalities. 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 IMPRESSION: 1. Small fat containing supraumbilical ventral abdominal wall hernia with mild inflammation at the base of the hernia neck. No bowel involvement. 2. No additional acute abnormality in the abdomen/pelvis. Electronically Signed   By: Narda Rutherford M.D.   On: 09/06/2020 20:49    Procedures Procedures   Medications Ordered in ED Medications  fentaNYL (SUBLIMAZE) injection 50 mcg (50 mcg Intravenous Given 09/06/20 2138)  ketorolac (TORADOL) 30 MG/ML injection 30 mg (30 mg Intravenous Given 09/06/20 2137)    ED Course  I have reviewed the triage vital signs and the nursing notes.  Pertinent labs & imaging results that were available during my care of the patient were reviewed by me and considered in my medical decision making (see chart for details).    MDM Rules/Calculators/A&P                          Patient with abdominal pain.  Ventral hernia reduced in the ER.  CT scan done and reassuring.  Did have small fat in the hernia but no bowel.   Patient was feeling much better from an abdominal standpoint.  Patient appears stable for following up with general surgery. Also had right buttock pain.  I think likely musculoskeletal.  Reproducible tenderness.  No other abnormality seen on CT scan.  No trauma.  Will treat symptomatically.  Discharge home with outpatient follow-up as needed. Final Clinical Impression(s) / ED Diagnoses Final diagnoses:  Ventral hernia without obstruction or gangrene  Musculoskeletal pain    Rx / DC Orders ED Discharge Orders         Ordered    methocarbamol (ROBAXIN) 500 MG tablet  Every 8 hours PRN        09/06/20 2204           Benjiman Core, MD 09/06/20 2233

## 2020-09-06 NOTE — ED Provider Notes (Signed)
Emergency Medicine Provider Triage Evaluation Note  Debbie Mccullough , a 25 y.o. female  was evaluated in triage.  Pt complains of abdominal pain just above the umbilicus for the past few days, seen in urgent care and thought to have a possible hernia, sent to the ED for further evaluation..  Review of Systems  Positive: Abdominal pain Negative: Changes in bowel or bladder habits  Physical Exam  BP (!) 142/94   Pulse (!) 51   Temp 98.5 F (36.9 C) (Oral)   Resp 17   Ht 5\' 3"  (1.6 m)   Wt 83.9 kg   LMP 08/19/2020   SpO2 100%   BMI 32.77 kg/m  Gen:   Awake, no distress   Resp:  Normal effort  MSK:   Moves extremities without difficulty  Other:  TTP upper abdomen   Medical Decision Making  Medically screening exam initiated at 4:14 PM.  Appropriate orders placed.  Der Gagliano was informed that the remainder of the evaluation will be completed by another provider, this initial triage assessment does not replace that evaluation, and the importance of remaining in the ED until their evaluation is complete.     Suzzanne Cloud, PA-C 09/06/20 1617    09/08/20, MD 09/07/20 1729

## 2020-09-06 NOTE — ED Provider Notes (Signed)
[facility]    CSN: [id number] Arrival date & time: 09/06/20  1136      History   Chief Complaint Chief Complaint  Patient presents with  . Abdominal Pain  . Back Pain    HPI Debbie Mccullough is a 25 y.o. female.   HPI   Abdominal Pain: Pt reports that for the past 1 days she has had sharp 9/10 abdominal pain and lower back pain.  She reports that for about 2 years she had a " bubble" above her navel which did not cause her any pain so she did not think anything of it but she reports that the pain is now in the same exact place.  Moving or any kind of Valsalva maneuver creates extreme pain.  She is not having any vomiting, nausea or changes in bowel habits.  She also does not have any dysuria, urinary frequency or hematuria.  She has a low-grade fever here today.   Past Medical History:  Diagnosis Date  . Bronchitis   . Diabetes mellitus without complication (HCC)   . Gestational diabetes   . Medical history non-contributory   . Pyelonephritis     Patient Active Problem List   Diagnosis Date Noted  . Pre-eclampsia in third trimester 11/09/2016  . Marijuana abuse 11/02/2016  . Gestational diabetes mellitus (GDM) affecting first pregnancy 09/17/2016  . Supervision of high risk pregnancy, antepartum, third trimester 09/03/2016  . Smoker 09/03/2016  . GBS (group b Streptococcus) UTI complicating pregnancy, second trimester 08/20/2016  . Pyelonephritis affecting pregnancy in second trimester 08/17/2016    Past Surgical History:  Procedure Laterality Date  . MULTIPLE TOOTH EXTRACTIONS      OB History    Gravida  1   Para  1   Term  1   Preterm      AB      Living  1     SAB      IAB      Ectopic      Multiple  0   Live Births  1            Home Medications    Prior to Admission medications   Medication Sig Start Date End Date Taking? Authorizing Provider  ibuprofen (ADVIL,MOTRIN) 600 MG tablet Take 1 tablet (600 mg total) by  mouth every 6 (six) hours. 11/12/16   Allie Bossier, MD  Prenatal Vit-Fe Fumarate-FA (PREPLUS) 27-1 MG TABS Take 1 tablet by mouth daily. 08/19/16   Anyanwu, Jethro Bastos, MD  ranitidine (ZANTAC) 150 MG capsule Take 1 capsule (150 mg total) by mouth 2 (two) times daily. 11/02/16   [place] Bing, MD    Family History Family History  Problem Relation Age of Onset  . Birth defects Maternal Grandfather   . Fibroids Mother   . Diabetes Maternal Grandmother     Social History Social History   Tobacco Use  . Smoking status: Former Smoker    Packs/day: 0.25    Years: 5.00    Pack years: 1.25    Types: Cigars    Quit date: 08/17/2016    Years since quitting: 4.0  . Smokeless tobacco: Never Used  . Tobacco comment: Stopped on her own at + UPT  Substance Use Topics  . Alcohol use: No    Alcohol/week: 3.0 standard drinks    Types: 3 Cans of beer per week    Comment: occasionally before pregnancy  . Drug use: No    Types: Marijuana  Comment: none since April     Allergies   Patient has no known allergies.   Review of Systems Review of Systems  As stated above in HPI Physical Exam Triage Vital Signs ED Triage Vitals  Enc Vitals Group     BP 09/06/20 1223 129/71     Pulse Rate 09/06/20 1223 (!) 56     Resp 09/06/20 1223 18     Temp 09/06/20 1223 99.8 F (37.7 C)     Temp src --      SpO2 09/06/20 1223 100 %     Weight --      Height --      Head Circumference --      Peak Flow --      Pain Score 09/06/20 1222 9     Pain Loc --      Pain Edu? --      Excl. in GC? --    No data found.  Updated Vital Signs BP 129/71   Pulse (!) 56   Temp 99.8 F (37.7 C)   Resp 18   LMP 08/19/2020   SpO2 100%   Breastfeeding No   Physical Exam Vitals and nursing note reviewed.  Constitutional:      General: She is not in acute distress.    Appearance: She is well-developed. She is not ill-appearing, toxic-appearing or diaphoretic.  Eyes:     Comments: No pallor or jaundice   Cardiovascular:     Rate and Rhythm: Normal rate and regular rhythm.     Heart sounds: Normal heart sounds.  Pulmonary:     Effort: Pulmonary effort is normal.     Breath sounds: Normal breath sounds.  Abdominal:     General: Abdomen is flat. Bowel sounds are normal. There is no distension.     Palpations: Abdomen is rigid. There is no hepatomegaly, splenomegaly or mass.     Tenderness: There is abdominal tenderness in the periumbilical area. There is guarding. There is no right CVA tenderness, left CVA tenderness or rebound. Negative signs include Murphy's sign, Rovsing's sign and McBurney's sign.     Hernia: A hernia is present. Hernia is present in the ventral area.  Skin:    General: Skin is warm.     Coloration: Skin is not jaundiced or pale.  Neurological:     Mental Status: She is alert and oriented to person, place, and time.      UC Treatments / Results  Labs (all labs ordered are listed, but only abnormal results are displayed) Labs Reviewed - No data to display  EKG   Radiology No results found.  Procedures Procedures (including critical care time)  Medications Ordered in UC Medications - No data to display  Initial Impression / Assessment and Plan / UC Course  I have reviewed the triage vital signs and the nursing notes.  Pertinent labs & imaging results that were available during my care of the patient were reviewed by me and considered in my medical decision making (see chart for details).     New.  I discussed my concern for strangulated hernia with patient.  I am referring her to the emergency room for CT scan imaging and further work-up.  She is agreeable.  She will be n.p.o. She elects private vehicle transfer.  Final Clinical Impressions(s) / UC Diagnoses   Final diagnoses:  None   Discharge Instructions   None    ED Prescriptions    None  PDMP not reviewed this encounter.   Rushie Chestnut, [place] 09/06/20 1339

## 2021-04-05 ENCOUNTER — Encounter (HOSPITAL_COMMUNITY): Payer: Self-pay | Admitting: Emergency Medicine

## 2021-04-05 ENCOUNTER — Emergency Department (HOSPITAL_COMMUNITY): Payer: Medicaid Other

## 2021-04-05 ENCOUNTER — Other Ambulatory Visit: Payer: Self-pay

## 2021-04-05 ENCOUNTER — Emergency Department (HOSPITAL_COMMUNITY)
Admission: EM | Admit: 2021-04-05 | Discharge: 2021-04-05 | Disposition: A | Discharge status: Home / Self Care | Payer: Medicaid Other | Attending: Emergency Medicine | Admitting: Emergency Medicine

## 2021-04-05 DIAGNOSIS — K439 Ventral hernia without obstruction or gangrene: Secondary | ICD-10-CM | POA: Diagnosis not present

## 2021-04-05 DIAGNOSIS — E119 Type 2 diabetes mellitus without complications: Secondary | ICD-10-CM | POA: Diagnosis not present

## 2021-04-05 DIAGNOSIS — R1012 Left upper quadrant pain: Secondary | ICD-10-CM | POA: Diagnosis not present

## 2021-04-05 DIAGNOSIS — Z87891 Personal history of nicotine dependence: Secondary | ICD-10-CM | POA: Diagnosis not present

## 2021-04-05 LAB — COMPREHENSIVE METABOLIC PANEL
ALT: 15 U/L (ref 0–44)
AST: 29 U/L (ref 15–41)
Albumin: 4 g/dL (ref 3.5–5.0)
Alkaline Phosphatase: 52 U/L (ref 38–126)
Anion gap: 7 (ref 5–15)
BUN: 5 mg/dL — ABNORMAL LOW (ref 6–20)
CO2: 29 mmol/L (ref 22–32)
Calcium: 9.2 mg/dL (ref 8.9–10.3)
Chloride: 102 mmol/L (ref 98–111)
Creatinine, Ser: 0.8 mg/dL (ref 0.44–1.00)
GFR, Estimated: >60 mL/min (ref >60)
Glucose, Bld: 103 mg/dL — ABNORMAL HIGH (ref 70–99)
Potassium: 4 mmol/L (ref 3.5–5.1)
Sodium: 138 mmol/L (ref 135–145)
Total Bilirubin: 0.5 mg/dL (ref 0.3–1.2)
Total Protein: 7.5 g/dL (ref 6.5–8.1)

## 2021-04-05 LAB — URINALYSIS, MICROSCOPIC (REFLEX): Bacteria, UA: NONE SEEN

## 2021-04-05 LAB — I-STAT BETA HCG BLOOD, ED (MC, WL, AP ONLY): I-stat hCG, quantitative: <5 m[IU]/mL (ref <5)

## 2021-04-05 LAB — URINALYSIS, ROUTINE W REFLEX MICROSCOPIC
Bilirubin Urine: NEGATIVE
Glucose, UA: NEGATIVE mg/dL
Hgb urine dipstick: NEGATIVE
Ketones, ur: NEGATIVE mg/dL
Nitrite: NEGATIVE
Protein, ur: NEGATIVE mg/dL
Specific Gravity, Urine: 1.015 (ref 1.005–1.030)
pH: 7.5 (ref 5.0–8.0)

## 2021-04-05 LAB — CBC
HCT: 46.9 % — ABNORMAL HIGH (ref 36.0–46.0)
Hemoglobin: 14.9 g/dL (ref 12.0–15.0)
MCH: 30 pg (ref 26.0–34.0)
MCHC: 31.8 g/dL (ref 30.0–36.0)
MCV: 94.4 fL (ref 80.0–100.0)
Platelets: 317 10*3/uL (ref 150–400)
RBC: 4.97 MIL/uL (ref 3.87–5.11)
RDW: 12.6 % (ref 11.5–15.5)
WBC: 7.4 10*3/uL (ref 4.0–10.5)
nRBC: 0 % (ref 0.0–0.2)

## 2021-04-05 LAB — LIPASE, BLOOD: Lipase: 25 U/L (ref 11–51)

## 2021-04-05 MED ORDER — IOHEXOL 300 MG/ML  SOLN
100.0000 mL | Freq: Once | INTRAMUSCULAR | Status: AC | PRN
  Administered 2021-04-05: 100 mL via INTRAVENOUS

## 2021-04-05 MED ORDER — HYDROCODONE-ACETAMINOPHEN 5-325 MG PO TABS: 1.0000 | ORAL_TABLET | Freq: Four times a day (QID) | ORAL | 0 refills | Status: DC | PRN

## 2021-04-05 NOTE — Discharge Instructions (Signed)
Call [facility] surgery for follow-up.  Probably time to get the ventral hernia fixed.  It shows what it showed before you have some fat in the area that is inflamed.  No evidence of any bowel entrapment.  Take pain medication as directed.  Work note provided.

## 2021-04-05 NOTE — ED Provider Notes (Addendum)
[facility] EMERGENCY DEPARTMENT Provider Note   CSN: [id number] Arrival date & time: 04/05/21  1431     History Chief Complaint  Patient presents with   Abdominal Pain    Debbie Mccullough is a 25 y.o. female.  Patient with a complaint of 3 to 4-day history of left-sided abdominal pain.  Just above the umbilicus.  Associated with nausea but no vomiting no diarrhea.  No fever.  Patient was evaluated in May for similar pain and is known to have a ventral hernia there.  Patient states there is been no discussion about getting that fixed.  Patient states that the discomfort was worse yesterday.  There was some redness in that area.  But that has resolved.      Past Medical History:  Diagnosis Date   Bronchitis    Diabetes mellitus without complication (HCC)    Gestational diabetes    Medical history non-contributory    Pyelonephritis     Patient Active Problem List   Diagnosis Date Noted   Pre-eclampsia in third trimester 11/09/2016   Marijuana abuse 11/02/2016   Gestational diabetes mellitus (GDM) affecting first pregnancy 09/17/2016   Supervision of high risk pregnancy, antepartum, third trimester 09/03/2016   Smoker 09/03/2016   GBS (group b Streptococcus) UTI complicating pregnancy, second trimester 08/20/2016   Pyelonephritis affecting pregnancy in second trimester 08/17/2016    Past Surgical History:  Procedure Laterality Date   MULTIPLE TOOTH EXTRACTIONS       OB History     Gravida  1   Para  1   Term  1   Preterm      AB      Living  1      SAB      IAB      Ectopic      Multiple  0   Live Births  1           Family History  Problem Relation Age of Onset   Birth defects Maternal Grandfather    Fibroids Mother    Diabetes Maternal Grandmother     Social History   Tobacco Use   Smoking status: Former    Packs/day: 0.25    Years: 5.00    Pack years: 1.25    Types: Cigars, Cigarettes    Quit date:  08/17/2016    Years since quitting: 4.6   Smokeless tobacco: Never   Tobacco comments:    Stopped on her own at + [facility]  Substance Use Topics   Alcohol use: No    Alcohol/week: 3.0 standard drinks    Types: 3 Cans of beer per week    Comment: occasionally before pregnancy   Drug use: No    Types: Marijuana    Comment: none since April    Home Medications Prior to Admission medications   Medication Sig Start Date End Date Taking? Authorizing Provider  HYDROcodone-acetaminophen (NORCO/VICODIN) 5-325 MG tablet Take 1 tablet by mouth every 6 (six) hours as needed for moderate pain. 04/05/21  Yes Vanetta Mulders, MD  ibuprofen (ADVIL,MOTRIN) 600 MG tablet Take 1 tablet (600 mg total) by mouth every 6 (six) hours. 11/12/16   Allie Bossier, MD  methocarbamol (ROBAXIN) 500 MG tablet Take 1 tablet (500 mg total) by mouth every 8 (eight) hours as needed for muscle spasms. 09/06/20   Benjiman Core, MD  Prenatal Vit-Fe Fumarate-FA (PREPLUS) 27-1 MG TABS Take 1 tablet by mouth daily. 08/19/16   Tereso Newcomer, MD  ranitidine (ZANTAC) 150 MG capsule Take 1 capsule (150 mg total) by mouth 2 (two) times daily. 11/02/16   Aletha Halim, MD    Allergies    Patient has no known allergies.  Review of Systems   Review of Systems  Constitutional:  Negative for chills and fever.  HENT:  Negative for ear pain and sore throat.   Eyes:  Negative for pain and visual disturbance.  Respiratory:  Negative for cough and shortness of breath.   Cardiovascular:  Negative for chest pain and palpitations.  Gastrointestinal:  Positive for abdominal pain and nausea. Negative for vomiting.  Genitourinary:  Negative for dysuria and hematuria.  Musculoskeletal:  Negative for arthralgias and back pain.  Skin:  Negative for color change and rash.  Neurological:  Negative for seizures and syncope.  All other systems reviewed and are negative.  Physical Exam Updated Vital Signs BP (!) 126/91    Pulse 61    Temp  98.8 F (37.1 C) (Oral)    Resp 18    LMP 03/07/2021    SpO2 100%   Physical Exam Vitals and nursing note reviewed.  Constitutional:      General: She is not in acute distress.    Appearance: Normal appearance. She is well-developed.  HENT:     Head: Normocephalic and atraumatic.  Eyes:     Conjunctiva/sclera: Conjunctivae normal.     Pupils: Pupils are equal, round, and reactive to light.  Cardiovascular:     Rate and Rhythm: Normal rate and regular rhythm.     Heart sounds: No murmur heard. Pulmonary:     Effort: Pulmonary effort is normal. No respiratory distress.     Breath sounds: Normal breath sounds.  Abdominal:     Palpations: Abdomen is soft. There is no mass.     Tenderness: There is abdominal tenderness. There is guarding.     Hernia: A hernia is present.  Musculoskeletal:        General: No swelling.     Cervical back: Neck supple.  Skin:    General: Skin is warm and dry.     Capillary Refill: Capillary refill takes less than 2 seconds.  Neurological:     General: No focal deficit present.     Mental Status: She is alert and oriented to person, place, and time.  Psychiatric:        Mood and Affect: Mood normal.    ED Results / Procedures / Treatments   Labs (all labs ordered are listed, but only abnormal results are displayed) Labs Reviewed  COMPREHENSIVE METABOLIC PANEL - Abnormal; Notable for the following components:      Result Value   Glucose, Bld 103 (*)    BUN 5 (*)    All other components within normal limits  CBC - Abnormal; Notable for the following components:   HCT 46.9 (*)    All other components within normal limits  URINALYSIS, ROUTINE W REFLEX MICROSCOPIC - Abnormal; Notable for the following components:   Leukocytes,Ua TRACE (*)    All other components within normal limits  LIPASE, BLOOD  URINALYSIS, MICROSCOPIC (REFLEX)  I-STAT BETA HCG BLOOD, ED ([facility], [facility], [facility] ONLY)    EKG None  Radiology CT Abdomen Pelvis W Contrast  Result  Date: 04/05/2021 CLINICAL DATA:  Nausea and vomiting. EXAM: CT ABDOMEN AND PELVIS WITH CONTRAST TECHNIQUE: Multidetector CT imaging of the abdomen and pelvis was performed using the standard protocol following bolus administration of intravenous contrast. CONTRAST:  110mL OMNIPAQUE  IOHEXOL 300 MG/ML  SOLN COMPARISON:  CT abdomen and pelvis 09/06/2020. FINDINGS: Lower chest: Minimal multifocal ground-glass opacities are seen in both lung bases, likely infectious/inflammatory. Hepatobiliary: No focal liver abnormality is seen. No gallstones, gallbladder wall thickening, or biliary dilatation. Pancreas: Unremarkable. No pancreatic ductal dilatation or surrounding inflammatory changes. Spleen: Normal in size without focal abnormality. Adrenals/Urinary Tract: Adrenal glands are unremarkable. Kidneys are normal, without renal calculi, focal lesion, or hydronephrosis. There is mild diffuse bladder wall thickening versus normal under distension. Stomach/Bowel: Stomach is within normal limits. Appendix appears normal. No evidence of bowel wall thickening, distention, or inflammatory changes. Vascular/Lymphatic: No significant vascular findings are present. No enlarged abdominal or pelvic lymph nodes. Reproductive: There is likely corpus luteum cyst in the left ovary. There is a small enhancing area in the left uterus measuring 8 mm, likely a fibroid. The right ovary is within normal limits. Other: Again seen is a supraumbilical fat containing ventral hernia. Compared to the prior examination. The hernia sac has increased in size now measuring 4.7 by 4.6 by 4.4 cm. Wall defect measures 2.0 by 1.5 cm. There is new fat stranding within the hernia sac. There is no ascites or free air. Musculoskeletal: No acute or significant osseous findings. IMPRESSION: 1. Moderate-sized supraumbilical ventral hernia has increased in size. The hernia contains fat (either mesenteric or omental) and inflammation. Findings can be seen in the  setting of infection or omental infarct. 2. Bladder wall thickening versus normal under distension. Correlate for cystitis. 3. Likely small left uterine fibroid. Electronically Signed   By: Darliss CheneyAmy  Guttmann M.D.   On: 04/05/2021 20:39    Procedures Procedures   Medications Ordered in ED Medications  iohexol (OMNIPAQUE) 300 MG/ML solution 100 mL (100 mLs Intravenous Contrast Given 04/05/21 2024)    ED Course  I have reviewed the triage vital signs and the nursing notes.  Pertinent labs & imaging results that were available during my care of the patient were reviewed by me and considered in my medical decision making (see chart for details).    MDM Rules/Calculators/A&P                         Patient with a known history of a left-sided ventral hernia.  Patient tender in that location.  Can feel the hernia.  Does not appear to be anything entrapped.  However she is tender and has guarding.  Will get CT scan to further evaluate.  Pregnancy test negative lipase normal complete metabolic panel without any acute findings.  CBC significant for no leukocytosis.  Hemoglobin is 14.9.  CT shows the ventral hernia which is increased in size.  No bowel entrapment.  Either omental fat or abdominal wall fat.  Probably with some inflammation may be a little bit of infarction.  We will have patient follow-up with [place] [facility].  Will give some pain medication.  And will give a work note.  Patient will return for any new or worse symptoms   Final Clinical Impression(s) / ED Diagnoses Final diagnoses:  Left upper quadrant abdominal pain  Ventral hernia without obstruction or gangrene    Rx / DC Orders ED Discharge Orders          Ordered    HYDROcodone-acetaminophen (NORCO/VICODIN) 5-325 MG tablet  Every 6 hours PRN        04/05/21 2049             Vanetta MuldersZackowski, Ival Pacer, MD 04/05/21 [phone]  Fredia Sorrow, MD 04/05/21 2051

## 2021-04-05 NOTE — ED Triage Notes (Signed)
Pt reports LUQ pain at hernia x 3-4 days with nausea.  Denies fever/chills but states it felt warm yesterday.

## 2021-10-18 ENCOUNTER — Emergency Department (HOSPITAL_COMMUNITY): Payer: Medicaid Other

## 2021-10-18 ENCOUNTER — Emergency Department (HOSPITAL_COMMUNITY)
Admission: EM | Admit: 2021-10-18 | Discharge: 2021-10-18 | Disposition: A | Discharge status: Home / Self Care | Payer: Medicaid Other | Attending: Emergency Medicine | Admitting: Emergency Medicine

## 2021-10-18 ENCOUNTER — Other Ambulatory Visit: Payer: Self-pay

## 2021-10-18 DIAGNOSIS — E1165 Type 2 diabetes mellitus with hyperglycemia: Secondary | ICD-10-CM | POA: Diagnosis not present

## 2021-10-18 DIAGNOSIS — R1013 Epigastric pain: Secondary | ICD-10-CM | POA: Diagnosis not present

## 2021-10-18 DIAGNOSIS — D582 Other hemoglobinopathies: Secondary | ICD-10-CM | POA: Diagnosis not present

## 2021-10-18 DIAGNOSIS — D72829 Elevated white blood cell count, unspecified: Secondary | ICD-10-CM | POA: Insufficient documentation

## 2021-10-18 DIAGNOSIS — R079 Chest pain, unspecified: Secondary | ICD-10-CM | POA: Diagnosis not present

## 2021-10-18 DIAGNOSIS — R112 Nausea with vomiting, unspecified: Secondary | ICD-10-CM | POA: Insufficient documentation

## 2021-10-18 DIAGNOSIS — R000 Tachycardia, unspecified: Secondary | ICD-10-CM | POA: Insufficient documentation

## 2021-10-18 LAB — COMPREHENSIVE METABOLIC PANEL
ALT: 19 U/L (ref 0–44)
AST: 21 U/L (ref 15–41)
Albumin: 4.4 g/dL (ref 3.5–5.0)
Alkaline Phosphatase: 52 U/L (ref 38–126)
Anion gap: 15 (ref 5–15)
BUN: 8 mg/dL (ref 6–20)
CO2: 22 mmol/L (ref 22–32)
Calcium: 9.4 mg/dL (ref 8.9–10.3)
Chloride: 103 mmol/L (ref 98–111)
Creatinine, Ser: 0.83 mg/dL (ref 0.44–1.00)
GFR, Estimated: >60 mL/min (ref >60)
Glucose, Bld: 129 mg/dL — ABNORMAL HIGH (ref 70–99)
Potassium: 3.6 mmol/L (ref 3.5–5.1)
Sodium: 140 mmol/L (ref 135–145)
Total Bilirubin: 0.8 mg/dL (ref 0.3–1.2)
Total Protein: 8.7 g/dL — ABNORMAL HIGH (ref 6.5–8.1)

## 2021-10-18 LAB — I-STAT BETA HCG BLOOD, ED (MC, WL, AP ONLY): I-stat hCG, quantitative: <5 m[IU]/mL (ref <5)

## 2021-10-18 LAB — CBC
HCT: 45.6 % (ref 36.0–46.0)
Hemoglobin: 15.7 g/dL — ABNORMAL HIGH (ref 12.0–15.0)
MCH: 31 pg (ref 26.0–34.0)
MCHC: 34.4 g/dL (ref 30.0–36.0)
MCV: 89.9 fL (ref 80.0–100.0)
Platelets: 319 10*3/uL (ref 150–400)
RBC: 5.07 MIL/uL (ref 3.87–5.11)
RDW: 12.8 % (ref 11.5–15.5)
WBC: 11.8 10*3/uL — ABNORMAL HIGH (ref 4.0–10.5)
nRBC: 0 % (ref 0.0–0.2)

## 2021-10-18 LAB — ETHANOL: Alcohol, Ethyl (B): <10 mg/dL (ref <10)

## 2021-10-18 LAB — LIPASE, BLOOD: Lipase: 26 U/L (ref 11–51)

## 2021-10-18 MED ORDER — SODIUM CHLORIDE 0.9 % IV BOLUS
1000.0000 mL | Freq: Once | INTRAVENOUS | Status: AC
  Administered 2021-10-18: 1000 mL via INTRAVENOUS

## 2021-10-18 MED ORDER — ALUM & MAG HYDROXIDE-SIMETH 200-200-20 MG/5ML PO SUSP
30.0000 mL | Freq: Once | ORAL | Status: AC
  Administered 2021-10-18: 30 mL via ORAL
  Filled 2021-10-18: qty 30

## 2021-10-18 MED ORDER — ONDANSETRON HCL 4 MG/2ML IJ SOLN
4.0000 mg | Freq: Once | INTRAMUSCULAR | Status: AC
  Administered 2021-10-18: 4 mg via INTRAVENOUS
  Filled 2021-10-18: qty 2

## 2021-10-18 MED ORDER — ONDANSETRON HCL 4 MG PO TABS: 4.0000 mg | ORAL_TABLET | Freq: Four times a day (QID) | ORAL | 0 refills | Status: DC

## 2021-10-18 MED ORDER — LIDOCAINE VISCOUS HCL 2 % MT SOLN: 15.0000 mL | OROMUCOSAL | 0 refills | Status: DC | PRN

## 2021-10-18 MED ORDER — MORPHINE SULFATE (PF) 2 MG/ML IV SOLN
2.0000 mg | Freq: Once | INTRAVENOUS | Status: AC
  Administered 2021-10-18: 2 mg via INTRAVENOUS
  Filled 2021-10-18: qty 1

## 2021-10-18 MED ORDER — LIDOCAINE VISCOUS HCL 2 % MT SOLN
15.0000 mL | Freq: Once | OROMUCOSAL | Status: AC
  Administered 2021-10-18: 15 mL via ORAL
  Filled 2021-10-18: qty 15

## 2021-10-18 NOTE — Discharge Instructions (Signed)
Commend you drink plenty of fluids, stick to a bland diet as I discussed above.  You can use some over-the-counter Maalox, Mylanta, Tums to help with any acid reflux or esophageal irritation symptoms.  I will prescribe you some viscous lidocaine which was part of the GI medicines that I gave you while you are in the emergency department today, as well as some nausea medication to help with any persistent nausea.  I recommend resting, and cutting back on drinking to help limit irritation to your stomach.  If you have worsening pain despite treatment I recommend return to the emergency department for further evaluation.

## 2021-10-18 NOTE — ED Triage Notes (Signed)
Pt here for N/V since last night. Pt reports drinking 2 cups of ETOH last night and began vomiting around 0300 today. Pt states she had 2 episodes with blood in it. Pt ambulatory, aox4, had no episodes of vomiting with [facility]. CBG 153, 98% RA, 138/80, 105HR

## 2021-10-18 NOTE — ED Notes (Signed)
Pt given water and crackers  

## 2021-10-18 NOTE — ED Provider Notes (Signed)
[facility] EMERGENCY DEPARTMENT Provider Note   CSN: [id number] Arrival date & time: 10/18/21  1106     History  Chief Complaint  Patient presents with   Emesis    Debbie Mccullough is a 26 y.o. female with past medical history significant for diabetes, previous pyelonephritis who presents with concern for nausea, vomiting, abdominal pain, chest pain with bloody emesis.  Patient reports that she had 2 cups of mixed drink last night which is not dissimilar from her normal alcohol intake.  Patient reports that she finished drinking around 2 AM, was trying to get to sleep, woke up suddenly at 3 AM and had violent vomiting and nausea.  Patient reports that she kept having to get up and down to vomit.  She reports multiple episodes of bloody emesis.  She is not having any bloody emesis at this time.  No previous history of Mallory-Weiss, Boerhaave's, esophageal varices, liver disease.  Patient has not taken anything for her symptoms at this time.  She reports the pain is radiating into her chest, feels burning in nature.  She does not take any blood thinners.  She denies dysuria, hematuria, vaginal discharge, dyspareunia.  She denies diarrhea, constipation.  She denies previous history of intra-abdominal surgery.  Chest pain does not seem to be associated with exertion, she denies any shortness of breath.   Emesis Associated symptoms: abdominal pain        Home Medications Prior to Admission medications   Medication Sig Start Date End Date Taking? Authorizing Provider  lidocaine (XYLOCAINE) 2 % solution Use as directed 15 mLs in the mouth or throat as needed for mouth pain. 10/18/21  Yes Sawyer Kahan H, PA-C  ondansetron (ZOFRAN) 4 MG tablet Take 1 tablet (4 mg total) by mouth every 6 (six) hours. 10/18/21  Yes Keneshia Tena H, PA-C  HYDROcodone-acetaminophen (NORCO/VICODIN) 5-325 MG tablet Take 1 tablet by mouth every 6 (six) hours as needed for moderate pain.  04/05/21   Vanetta Mulders, MD  ibuprofen (ADVIL,MOTRIN) 600 MG tablet Take 1 tablet (600 mg total) by mouth every 6 (six) hours. 11/12/16   Allie Bossier, MD  methocarbamol (ROBAXIN) 500 MG tablet Take 1 tablet (500 mg total) by mouth every 8 (eight) hours as needed for muscle spasms. 09/06/20   Benjiman Core, MD  Prenatal Vit-Fe Fumarate-FA (PREPLUS) 27-1 MG TABS Take 1 tablet by mouth daily. 08/19/16   Anyanwu, Jethro Bastos, MD  ranitidine (ZANTAC) 150 MG capsule Take 1 capsule (150 mg total) by mouth 2 (two) times daily. 11/02/16   [place] Bing, MD      Allergies    Patient has no known allergies.    Review of Systems   Review of Systems  Cardiovascular:  Positive for chest pain.  Gastrointestinal:  Positive for abdominal pain, nausea and vomiting.  All other systems reviewed and are negative.   Physical Exam Updated Vital Signs BP 122/79 (BP Location: Left Arm)   Pulse 85   Temp 97.9 F (36.6 C) (Oral)   Resp 18   Ht 5\' 3"  (1.6 m)   Wt 85.7 kg   LMP  (LMP Unknown)   SpO2 100%   BMI 33.48 kg/m  Physical Exam Vitals and nursing note reviewed.  Constitutional:      General: She is in acute distress.     Appearance: Normal appearance. She is ill-appearing.     Comments: Patient is uncomfortable, tearful, and clearly in pain at time my initial evaluation, but she  is nontoxic, nonseptic appearing  HENT:     Head: Normocephalic and atraumatic.  Eyes:     General:        Right eye: No discharge.        Left eye: No discharge.  Cardiovascular:     Rate and Rhythm: Regular rhythm. Tachycardia present.     Heart sounds: No murmur heard.    No friction rub. No gallop.  Pulmonary:     Effort: Pulmonary effort is normal.     Breath sounds: Normal breath sounds.     Comments: No subcutaneous emphysema, crepitus noted.  No accessory breath sounds, wheezing, rhonchi, stridor, rales Abdominal:     General: Bowel sounds are normal.     Palpations: Abdomen is soft.     Comments:  Tenderness palpation throughout the abdomen with some guarding, no rebound, rigidity.  No distention noted.  No signs of incarcerated or strangulated hernia, no significant protrusion of hernia on Valsalva.  Tenderness to palpation of epigastric region significantly improved after medication.  Skin:    General: Skin is warm and dry.     Capillary Refill: Capillary refill takes less than 2 seconds.  Neurological:     Mental Status: She is alert and oriented to person, place, and time.  Psychiatric:        Mood and Affect: Mood normal.        Behavior: Behavior normal.     ED Results / Procedures / Treatments   Labs (all labs ordered are listed, but only abnormal results are displayed) Labs Reviewed  COMPREHENSIVE METABOLIC PANEL - Abnormal; Notable for the following components:      Result Value   Glucose, Bld 129 (*)    Total Protein 8.7 (*)    All other components within normal limits  CBC - Abnormal; Notable for the following components:   WBC 11.8 (*)    Hemoglobin 15.7 (*)    All other components within normal limits  LIPASE, BLOOD  ETHANOL  URINALYSIS, ROUTINE W REFLEX MICROSCOPIC  I-STAT BETA HCG BLOOD, ED ([facility], [facility], [facility] ONLY)    EKG EKG Interpretation  Date/Time:  Saturday October 18 2021 11:27:53 EDT Ventricular Rate:  90 PR Interval:  136 QRS Duration: 89 QT Interval:  372 QTC Calculation: 456 R Axis:   40 Text Interpretation: Normal sinus rhythm no acute ST changes No old tracing to compare Confirmed by Pricilla Loveless [id number]) on 10/18/2021 11:30:37 AM  Radiology DG Chest Portable 1 View  Result Date: 10/18/2021 CLINICAL DATA:  Chest pain with bloody emesis.  Nausea vomiting. EXAM: PORTABLE CHEST 1 VIEW COMPARISON:  None Available. FINDINGS: Cardiac silhouette is normal in size. Normal mediastinal and hilar contours. Clear lungs.  No pleural effusion or pneumothorax. Skeletal structures are grossly unremarkable. IMPRESSION: No active disease. Electronically Signed    By: Amie Portland M.D.   On: 10/18/2021 11:38    Procedures Procedures    Medications Ordered in ED Medications  alum & mag hydroxide-simeth (MAALOX/MYLANTA) 200-200-20 MG/5ML suspension 30 mL (30 mLs Oral Given 10/18/21 1141)    And  lidocaine (XYLOCAINE) 2 % viscous mouth solution 15 mL (15 mLs Oral Given 10/18/21 1141)  sodium chloride 0.9 % bolus 1,000 mL (1,000 mLs Intravenous New Bag/Given 10/18/21 1205)  ondansetron (ZOFRAN) injection 4 mg (4 mg Intravenous Given 10/18/21 1205)  morphine (PF) 2 MG/ML injection 2 mg (2 mg Intravenous Given 10/18/21 1205)    ED Course/ Medical Decision Making/ A&P Clinical Course as of 10/18/21  1336  Sat Oct 18, 2021  1141 DG Chest Portable 1 View No free air under diaphragm or signs of subcutaneous air surrounding esophagus [CP]    Clinical Course User Index [CP] Olene Floss, PA-C                           Medical Decision Making Amount and/or Complexity of Data Reviewed Labs: ordered. Radiology: ordered.  Risk OTC drugs. Prescription drug management.   This patient is a 26 y.o. female who presents to the ED for concern of nausea, vomiting, abdominal pain, this involves an extensive number of treatment options, and is a complaint that carries with it a high risk of complications and morbidity. The emergent differential diagnosis prior to evaluation includes, but is not limited to, boerhaave's, mallory-weiss, esophagitis, gastritis, gastroenteritis, acute intoxication, hangover, other intra-abdominal infection, ACS, AAS, PE, overall think cardiopulmonary primary etiology is less likely given patient's presentation, history of EtOH intoxication, and bloody vomiting, however but will consider these differentials.   This is not an exhaustive differential.   Past Medical History / Co-morbidities / Social History: Diabetes, previous pyelonephritis, no previous intra-abdominal surgery  Additional history: Chart reviewed. Pertinent results  include: Reviewed lab work, imaging from recent previous emergency department visit, outpatient OB/GYN visits  Physical Exam: Physical exam performed. The pertinent findings include: Patient in some distress on initial arrival, tearful, and in pain.  On reevaluation after stabilizing medication with GI cocktail, morphine x1, and Zofran patient in no distress, resting comfortably in bed.  She still has some tenderness palpation of the epigastric region, continues to have no subcutaneous emphysema, and is no longer complaining of any chest or esophageal pain.  Lab Tests: I ordered, and personally interpreted labs.  The pertinent results include: CMP unremarkable, minimally elevated glucose at 129.  Nonfasting.  CBC notable for very minimal leukocytosis, white blood cells 11.8.  There is a concomitant elevation of hemoglobin at 15.7, favor slight hemoconcentration from dehydration.   Imaging Studies: I ordered imaging studies including plain film chest x-ray. I independently visualized and interpreted imaging which showed no significant intrathoracic abnormality, notably no free air under diaphragm or signs of subcutaneous air surrounding esophagus. I agree with the radiologist interpretation.   Cardiac Monitoring:  The patient was maintained on a cardiac monitor.  My attending physician Dr. Criss Alvine viewed and interpreted the cardiac monitored which showed an underlying rhythm of: NSR. I agree with this interpretation.   Medications: I ordered medication including morphine for pain, Zofran, GI cocktail, and fluid bolus for stomach pain, esophageal irritation. Reevaluation of the patient after these medicines showed that the patient improved. I have reviewed the patients home medicines and have made adjustments as needed.  Patient with significant improvement of pain on reevaluation, she is not pain-free, discussed that with presumed gastritis versus other irritation secondary to vomiting from drinking I  expect that she will continue to have some lingering epigastric pain due to esophageal irritation from stomach acid.  At this time low clinical concern for active Mallory-Weiss bleed, or Boerhaave's.  Patient with no clinical signs and symptoms consistent with ACS especially with complete resolution of chest pain after GI cocktail x1 and reasonable alternative explanation related to patient's drinking and vomiting.   Disposition: After consideration of the diagnostic results and the patients response to treatment, I feel that patient is stable for discharge, we will encourage gentle diet, plenty of fluid intake, discharged with some Zofran.  Patient passed p.o. challenge prior to discharge.   emergency department workup does not suggest an emergent condition requiring admission or immediate intervention beyond what has been performed at this time. The plan is: Patient's symptoms are consistent with esophageal irritation, gastritis, versus mild gastroenteritis, likely think her symptoms are related to her alcohol use yesterday.  She does have a history of ventral hernia, ventral hernia with no signs of incarceration, strangulation.  Encourage gentle diet, rest, plenty of fluids, will prescribe some viscous lidocaine and Zofran, also encouraged some over-the-counter Maalox, Mylanta. The patient is safe for discharge and has been instructed to return immediately for worsening symptoms, change in symptoms or any other concerns.  I discussed this case with my attending physician Dr. Criss Alvine who cosigned this note including patient's presenting symptoms, physical exam, and planned diagnostics and interventions. Attending physician stated agreement with plan or made changes to plan which were implemented.    Final Clinical Impression(s) / ED Diagnoses Final diagnoses:  Nausea and vomiting, unspecified vomiting type  Epigastric abdominal pain    Rx / DC Orders ED Discharge Orders          Ordered     lidocaine (XYLOCAINE) 2 % solution  As needed        10/18/21 1334    ondansetron (ZOFRAN) 4 MG tablet  Every 6 hours        10/18/21 1334              Miles Leyda, [place] H, PA-C 10/18/21 1336    Pricilla Loveless, MD 10/18/21 1344

## 2021-10-18 NOTE — ED Notes (Signed)
X-ray at bedside

## 2022-04-20 NOTE — L&D Delivery Note (Addendum)
Delivery Note Debbie Mccullough is a 27 y.o. G2P1001 at [redacted]w[redacted]d admitted for spontaneous onset of labor.   GBS Status: Positive/-- (09/26 1214) Maximum Maternal Temperature: 98.75F  Labor course: Initial SVE: 5/70/ballotable. No augmentation. She then progressed to complete.  ROM: 0h 12m with thick meconium fluid  Birth: At 1841 a viable female was delivered via spontaneous vaginal delivery (Presentation: ;  ). Nuchal cord present: No.  Shoulders and body delivered in usual fashion. Infant placed directly on mom's abdomen for bonding/skin-to-skin, baby dried and stimulated. Cord clamped x 2 after 1 minute and cut by support person.  Cord blood collected.  The placenta separated spontaneously and delivered via gentle cord traction.  Pitocin infused rapidly IV per protocol.  Fundus firm with massage.  Placenta inspected and appears to be intact with a 3 VC.  Sponge and instrument count were correct x2.  Intrapartum complications: None Anesthesia:  epidural Episiotomy: none Lacerations:  2nd degree perineal repaired with 3-0 Vicryl suture; periurethral hemostatic Suture Repair: 3.0 vicryl EBL (mL): 109   Infant: APGAR (1 MIN): 8  APGAR (5 MINS): 9  APGAR (10 MINS):    Infant weight: pending  Mom to postpartum.  Baby to Couplet care / Skin to Skin. Placenta to L&D   Plans to  formula Contraception: Nexplanon Circumcision: unsure  Note sent to [facility]: [facility] for pp visit.  Charma Igo, MD 01/28/2023 7:17 PM  Attending ATTESTATION  I was present and gloved for this delivery and agree with the above documentation in the resident's note except as below.    Celedonio Savage, MD Center for [employer] (Faculty Practice) 01/28/2023, 7:37 PM

## 2022-09-06 ENCOUNTER — Encounter (HOSPITAL_COMMUNITY): Payer: Self-pay

## 2022-09-06 ENCOUNTER — Other Ambulatory Visit: Payer: Self-pay

## 2022-09-06 ENCOUNTER — Emergency Department (HOSPITAL_COMMUNITY)
Admission: EM | Admit: 2022-09-06 | Discharge: 2022-09-06 | Disposition: A | Discharge status: Home / Self Care | Payer: 59 | Attending: Emergency Medicine | Admitting: Emergency Medicine

## 2022-09-06 DIAGNOSIS — R112 Nausea with vomiting, unspecified: Secondary | ICD-10-CM | POA: Diagnosis not present

## 2022-09-06 DIAGNOSIS — H01004 Unspecified blepharitis left upper eyelid: Secondary | ICD-10-CM | POA: Diagnosis not present

## 2022-09-06 DIAGNOSIS — E119 Type 2 diabetes mellitus without complications: Secondary | ICD-10-CM | POA: Diagnosis not present

## 2022-09-06 DIAGNOSIS — H01001 Unspecified blepharitis right upper eyelid: Secondary | ICD-10-CM | POA: Diagnosis not present

## 2022-09-06 DIAGNOSIS — H5789 Other specified disorders of eye and adnexa: Secondary | ICD-10-CM | POA: Diagnosis not present

## 2022-09-06 DIAGNOSIS — Z3201 Encounter for pregnancy test, result positive: Secondary | ICD-10-CM | POA: Diagnosis not present

## 2022-09-06 LAB — PREGNANCY, URINE: Preg Test, Ur: POSITIVE — AB

## 2022-09-06 MED ORDER — ERYTHROMYCIN 5 MG/GM OP OINT: TOPICAL_OINTMENT | OPHTHALMIC | 0 refills | Status: DC

## 2022-09-06 MED ORDER — DOXYLAMINE-PYRIDOXINE 10-10 MG PO TBEC: 1.0000 | DELAYED_RELEASE_TABLET | Freq: Four times a day (QID) | ORAL | 0 refills | Status: DC | PRN

## 2022-09-06 NOTE — ED Triage Notes (Signed)
Pt states she thought she had a sty in her left eye that it would go away, but it hasn't. Pt states this has been going on for 2wks. Pt has 1+ swelling of left upper eye lid. Pt c/o left eye drainagex1wk. Pt states her left eye was closed with drainage this morning upon waking

## 2022-09-06 NOTE — Discharge Instructions (Addendum)
Return to the ED with any new or worsening symptoms such as vaginal bleeding, extreme abdominal pain Please follow-up with the women Center which I referred you to.  Please call tomorrow morning and make an appointment to be seen for your new pregnancy. Please read the attached guide concerning blepharitis.  Please pick up erythromycin ointment that I have sent to your pharmacy for your blepharitis.  Please place 1/2 inch ribbon on your upper eyelid. Please take diclegis for your nausea and vomiting.  I have sent this to your pharmacy.

## 2022-09-06 NOTE — ED Provider Notes (Signed)
[place] EMERGENCY DEPARTMENT AT [facility] Provider Note   CSN: [id number] Arrival date & time: 09/06/22  1808     History  Chief Complaint  Patient presents with   eye problem    Debbie Mccullough is a 27 y.o. female with medical Struve diabetes, bronchitis, pyelonephritis.  Patient resents to the ED for evaluation of left eye discomfort.  Patient reports that she has had pain and irritation to her upper left eyelid for the last 2 weeks.  She believes it is a stye.  She states that she has tried warm compresses at home however it has not significantly decreased the size of the stye.  She states that the warm compresses have alleviated a lot of the swelling however she still believes that there is "infection" there.  She denies any fevers, trouble seeing, contact use.  She states that this morning her eye was closed with drainage this morning upon waking.  The patient is also requesting pregnancy testing.  The patient reports she has not had her cycle in 2 months.  She states that sometimes she will miss a month however she also has not been feeling well recently, she vomited yesterday.  She has not taken a pregnancy test at home.  She denies any abdominal pain.  Denies any vaginal discharge or bleeding. HPI     Home Medications Prior to Admission medications   Medication Sig Start Date End Date Taking? Authorizing Provider  Doxylamine-Pyridoxine 10-10 MG TBEC Take 1 tablet by mouth every 6 (six) hours as needed. 09/06/22  Yes Al Decant, PA-C  erythromycin ophthalmic ointment Place a 1/2 inch ribbon of ointment into the upper eyelid. 09/06/22  Yes Al Decant, PA-C  HYDROcodone-acetaminophen (NORCO/VICODIN) 5-325 MG tablet Take 1 tablet by mouth every 6 (six) hours as needed for moderate pain. 04/05/21   Vanetta Mulders, MD  ibuprofen (ADVIL,MOTRIN) 600 MG tablet Take 1 tablet (600 mg total) by mouth every 6 (six) hours. 11/12/16   Allie Bossier, MD  lidocaine  (XYLOCAINE) 2 % solution Use as directed 15 mLs in the mouth or throat as needed for mouth pain. 10/18/21   Prosperi, Christian H, PA-C  methocarbamol (ROBAXIN) 500 MG tablet Take 1 tablet (500 mg total) by mouth every 8 (eight) hours as needed for muscle spasms. 09/06/20   Benjiman Core, MD  ondansetron (ZOFRAN) 4 MG tablet Take 1 tablet (4 mg total) by mouth every 6 (six) hours. 10/18/21   Prosperi, Christian H, PA-C  Prenatal Vit-Fe Fumarate-FA (PREPLUS) 27-1 MG TABS Take 1 tablet by mouth daily. 08/19/16   Anyanwu, Jethro Bastos, MD  ranitidine (ZANTAC) 150 MG capsule Take 1 capsule (150 mg total) by mouth 2 (two) times daily. 11/02/16   Waxahachie Bing, MD      Allergies    Patient has no known allergies.    Review of Systems   Review of Systems  Constitutional:  Negative for fever.  Eyes:  Positive for discharge and redness.  Gastrointestinal:  Positive for nausea and vomiting. Negative for abdominal pain.  Genitourinary:  Positive for menstrual problem. Negative for dysuria, hematuria, pelvic pain, vaginal bleeding and vaginal discharge.  All other systems reviewed and are negative.   Physical Exam Updated Vital Signs BP (!) 167/79 (BP Location: Right Arm)   Pulse 64   Temp 99.5 F (37.5 C) (Oral)   Resp 16   Ht 5\' 3"  (1.6 m)   Wt 85.7 kg   SpO2 100%   BMI  33.47 kg/m  Physical Exam Vitals and nursing note reviewed.  Constitutional:      General: She is not in acute distress.    Appearance: She is well-developed.  HENT:     Head: Normocephalic and atraumatic.     Mouth/Throat:     Mouth: Mucous membranes are moist.     Pharynx: Oropharynx is clear.  Eyes:     Comments: Slight swelling the patient left upper eyelid consistent blepharitis.  No obvious stye.  No conjunctival injection.  EOMs nonpainful.  Cardiovascular:     Rate and Rhythm: Normal rate and regular rhythm.     Heart sounds: No murmur heard. Pulmonary:     Effort: Pulmonary effort is normal. No respiratory  distress.     Breath sounds: Normal breath sounds.  Abdominal:     Palpations: Abdomen is soft.     Tenderness: There is no abdominal tenderness. There is no right CVA tenderness or left CVA tenderness.     Comments: Abdomen soft compressible throughout  Musculoskeletal:        General: No swelling.     Cervical back: Neck supple.  Skin:    General: Skin is warm and dry.     Capillary Refill: Capillary refill takes less than 2 seconds.  Neurological:     Mental Status: She is alert and oriented to person, place, and time.  Psychiatric:        Mood and Affect: Mood normal.     ED Results / Procedures / Treatments   Labs (all labs ordered are listed, but only abnormal results are displayed) Labs Reviewed  PREGNANCY, URINE - Abnormal; Notable for the following components:      Result Value   Preg Test, Ur POSITIVE (*)    All other components within normal limits    EKG None  Radiology No results found.  Procedures Procedures   Medications Ordered in ED Medications - No data to display  ED Course/ Medical Decision Making/ A&P  Medical Decision Making Amount and/or Complexity of Data Reviewed Labs: ordered.   27 year old female presents to ED for evaluation of left upper eye swelling as well as concern for pregnancy.  Please see HPI for further details.  On exam the patient is afebrile nontachycardic.  Lung sounds are clear bilaterally and she is not hypoxic.  Her abdomen is soft and compressible throughout.  Her left does have swelling to her left upper eyelid.  Her EOMs are nonpainful.  There is no conjunctival injection.  There is no obvious appearance of a stye but the patient could be suffering from blepharitis.  We will place the patient with erythromycin ointment.  Patient also requesting pregnancy test.  Pregnancy test positive here.  She denies vaginal discharge, vaginal bleeding or abdominal pain.  She is complaining of nausea and vomiting.  We will refer the  patient to the women Center for further management of her new pregnancy.  We will also give the patient diclegis to go home with for her nausea and vomiting.  The patient was advised to return to the ED with any new or worsening symptoms such as vaginal bleeding, abdominal pain.  The patient had all of her questions answered to her satisfaction.  The patient is stable to discharge at this time.   Final Clinical Impression(s) / ED Diagnoses Final diagnoses:  Blepharitis of left upper eyelid, unspecified type    Rx / DC Orders ED Discharge Orders  Ordered    Doxylamine-Pyridoxine 10-10 MG TBEC  Every 6 hours PRN        09/06/22 1947    erythromycin ophthalmic ointment        09/06/22 1953              Clent Ridges 09/06/22 1954    Wynetta Fines, MD 09/06/22 2027

## 2022-10-26 ENCOUNTER — Ambulatory Visit (INDEPENDENT_AMBULATORY_CARE_PROVIDER_SITE_OTHER): Payer: 59 | Admitting: Family Medicine

## 2022-10-26 ENCOUNTER — Ambulatory Visit: Payer: 59 | Admitting: Family

## 2022-10-26 ENCOUNTER — Other Ambulatory Visit: Payer: Self-pay

## 2022-10-26 ENCOUNTER — Encounter: Payer: Self-pay | Admitting: Family Medicine

## 2022-10-26 VITALS — Wt 190.1 lb

## 2022-10-26 DIAGNOSIS — O0932 Supervision of pregnancy with insufficient antenatal care, second trimester: Secondary | ICD-10-CM

## 2022-10-26 DIAGNOSIS — Z3492 Encounter for supervision of normal pregnancy, unspecified, second trimester: Secondary | ICD-10-CM | POA: Diagnosis not present

## 2022-10-26 DIAGNOSIS — O09299 Supervision of pregnancy with other poor reproductive or obstetric history, unspecified trimester: Secondary | ICD-10-CM | POA: Diagnosis not present

## 2022-10-26 DIAGNOSIS — Z6791 Unspecified blood type, Rh negative: Secondary | ICD-10-CM | POA: Diagnosis not present

## 2022-10-26 DIAGNOSIS — O99820 Streptococcus B carrier state complicating pregnancy: Secondary | ICD-10-CM | POA: Diagnosis not present

## 2022-10-26 DIAGNOSIS — O24415 Gestational diabetes mellitus in pregnancy, controlled by oral hypoglycemic drugs: Secondary | ICD-10-CM | POA: Diagnosis not present

## 2022-10-26 DIAGNOSIS — O09893 Supervision of other high risk pregnancies, third trimester: Secondary | ICD-10-CM | POA: Diagnosis not present

## 2022-10-26 DIAGNOSIS — Z3A25 25 weeks gestation of pregnancy: Secondary | ICD-10-CM

## 2022-10-26 DIAGNOSIS — Z8632 Personal history of gestational diabetes: Secondary | ICD-10-CM

## 2022-10-26 DIAGNOSIS — Z3490 Encounter for supervision of normal pregnancy, unspecified, unspecified trimester: Secondary | ICD-10-CM | POA: Insufficient documentation

## 2022-10-26 DIAGNOSIS — K439 Ventral hernia without obstruction or gangrene: Secondary | ICD-10-CM | POA: Diagnosis not present

## 2022-10-26 DIAGNOSIS — O26893 Other specified pregnancy related conditions, third trimester: Secondary | ICD-10-CM | POA: Diagnosis not present

## 2022-10-26 DIAGNOSIS — Z2839 Other underimmunization status: Secondary | ICD-10-CM | POA: Diagnosis not present

## 2022-10-26 DIAGNOSIS — O09292 Supervision of pregnancy with other poor reproductive or obstetric history, second trimester: Secondary | ICD-10-CM

## 2022-10-26 DIAGNOSIS — Z3A38 38 weeks gestation of pregnancy: Secondary | ICD-10-CM | POA: Diagnosis not present

## 2022-10-26 LAB — POCT URINALYSIS DIP (DEVICE)
Bilirubin Urine: NEGATIVE
Glucose, UA: NEGATIVE mg/dL
Hgb urine dipstick: NEGATIVE
Ketones, ur: NEGATIVE mg/dL
Nitrite: NEGATIVE
Protein, ur: NEGATIVE mg/dL
Specific Gravity, Urine: 1.025 (ref 1.005–1.030)
Urobilinogen, UA: 0.2 mg/dL (ref 0.0–1.0)
pH: 7 (ref 5.0–8.0)

## 2022-10-26 MED ORDER — PREPLUS 27-1 MG PO TABS: 1.0000 | ORAL_TABLET | Freq: Every day | ORAL | 13 refills | Status: AC

## 2022-10-26 MED ORDER — ASPIRIN 81 MG PO TBEC: 81.0000 mg | DELAYED_RELEASE_TABLET | Freq: Every day | ORAL | 12 refills | Status: DC

## 2022-10-26 NOTE — Progress Notes (Signed)
History:   Debbie Mccullough is a 27 y.o. G2P1001 at [redacted]w[redacted]d by LMP being seen today for her first obstetrical visit.  Her obstetrical history is significant for  hx of GDM, preE, prior vaginal delivery . Patient does not intend to breast feed. Pregnancy history fully reviewed.  Patient reports no complaints.      HISTORY: OB History  Gravida Para Term Preterm AB Living  2 1 1  0 0 1  SAB IAB Ectopic Multiple Live Births  0 0 0 0 1    # Outcome Date GA Lbr Len/2nd Weight Sex Delivery Anes PTL Lv  2 Current           1 Term 11/11/16 [redacted]w[redacted]d 16:27 / 01:05 6 lb 12.8 oz (3.084 kg) F Vag-Spont EPI  LIV     Name: CASSONDRA, KOSCIOLEK     Apgar1: 8  Apgar5: 9    Last pap smear was done 2018 and was normal  Past Medical History:  Diagnosis Date   Bronchitis    Diabetes mellitus without complication (HCC)    Gestational diabetes    Medical history non-contributory    Pyelonephritis    Past Surgical History:  Procedure Laterality Date   MULTIPLE TOOTH EXTRACTIONS     NO PAST SURGERIES     Family History  Problem Relation Age of Onset   Birth defects Maternal Grandfather    Fibroids Mother    Diabetes Maternal Grandmother    Social History   Tobacco Use   Smoking status: Former    Packs/day: 0.25    Years: 5.00    Additional pack years: 0.00    Total pack years: 1.25    Types: Cigars, Cigarettes    Quit date: 08/17/2016    Years since quitting: 6.1   Smokeless tobacco: Never   Tobacco comments:    Stopped on her own at + UPT  Vaping Use   Vaping Use: Never used  Substance Use Topics   Alcohol use: No    Alcohol/week: 3.0 standard drinks of alcohol    Types: 3 Cans of beer per week    Comment: occasionally before pregnancy   Drug use: No    Types: Marijuana    Comment: none since April   No Known Allergies No current outpatient medications on file prior to visit.   No current facility-administered medications on file prior to visit.    Review of  Systems Pertinent items noted in HPI and remainder of comprehensive ROS otherwise negative. Physical Exam:   Vitals:   10/26/22 1023  Weight: 190 lb 1.6 oz (86.2 kg)   Fetal Heart Rate (bpm): 146  Constitutional: Well-developed, well-nourished pregnant female in no acute distress.  HEENT: PERRLA Skin: normal color and turgor, no rash Cardiovascular: normal rate & rhythm, no murmur Respiratory: normal effort, lung sounds clear throughout GI: Abd soft, non-tender, pos BS x 4, gravid appropriate for gestational age MS: Extremities nontender, no edema, normal ROM Neurologic: Alert and oriented x 4.   Assessment:    Pregnancy: G2P1001 Patient Active Problem List   Diagnosis Date Noted   Supervision of low-risk pregnancy 10/26/2022   Late prenatal care affecting pregnancy in second trimester 10/26/2022   Marijuana abuse 11/02/2016     Plan:    1. Encounter for supervision of low-risk pregnancy in second trimester - [facility] AMB BABYSCRIPTS SCHEDULE OPTIMIZATION - [place] MFM OB DETAIL +14 WK; Future - CBC/D/Plt+RPR+Rh+ABO+RubIgG... - Culture, OB Urine - Hemoglobin A1c - PANORAMA PRENATAL TEST - HORIZON  Basic Panel - Prenatal Vit-Fe Fumarate-FA (PREPLUS) 27-1 MG TABS; Take 1 tablet by mouth daily.  Dispense: 30 tablet; Refill: 13  2. Late prenatal care affecting pregnancy in second trimester - [place] MFM OB DETAIL +14 WK; Future  3. [redacted] weeks gestation of pregnancy Follow up in 2 weeks  4. Hx of preeclampsia, prior pregnancy, currently pregnant Different partner than last pregnancy.  - aspirin EC 81 MG tablet; Take 1 tablet (81 mg total) by mouth daily. Swallow whole.  Dispense: 30 tablet; Refill: 12 - Comprehensive metabolic panel - Protein / creatinine ratio, urine  5. History of gestational diabetes in prior pregnancy, currently pregnant - Hemoglobin A1c  6. Abdominal wall hernia Stable. Desires correction PP.     Initial labs drawn. Continue prenatal vitamins. Problem  list reviewed and updated. Genetic Screening ordered Ultrasound discussed; fetal anatomic survey: ordered. Anticipatory guidance about prenatal visits given including labs, ultrasounds, and testing.  Return in about 2 weeks (around 11/09/2022) for LROB follow up.    Lavonda Jumbo, DO OB Fellow, Faculty [facility], Center for [facility] 10/26/2022, 11:07 AM\

## 2022-10-26 NOTE — Progress Notes (Signed)
Anatomy U/S scheduled for 11/19/22 at 1015.  Pt notified.    Debbie Mccullough  10/26/22

## 2022-10-27 ENCOUNTER — Encounter: Payer: Self-pay | Admitting: Family Medicine

## 2022-10-27 DIAGNOSIS — O09899 Supervision of other high risk pregnancies, unspecified trimester: Secondary | ICD-10-CM | POA: Insufficient documentation

## 2022-10-27 DIAGNOSIS — Z6791 Unspecified blood type, Rh negative: Secondary | ICD-10-CM | POA: Insufficient documentation

## 2022-10-27 LAB — COMPREHENSIVE METABOLIC PANEL
ALT: 6 IU/L (ref 0–32)
AST: 9 IU/L (ref 0–40)
Albumin: 3.8 g/dL — ABNORMAL LOW (ref 4.0–5.0)
Alkaline Phosphatase: 81 IU/L (ref 44–121)
BUN/Creatinine Ratio: 7 — ABNORMAL LOW (ref 9–23)
BUN: 3 mg/dL — ABNORMAL LOW (ref 6–20)
Bilirubin Total: <0.2 mg/dL (ref 0.0–1.2)
CO2: 20 mmol/L (ref 20–29)
Calcium: 8.9 mg/dL (ref 8.7–10.2)
Chloride: 102 mmol/L (ref 96–106)
Creatinine, Ser: 0.41 mg/dL — ABNORMAL LOW (ref 0.57–1.00)
Globulin, Total: 2.8 g/dL (ref 1.5–4.5)
Glucose: 92 mg/dL (ref 70–99)
Potassium: 4.2 mmol/L (ref 3.5–5.2)
Sodium: 135 mmol/L (ref 134–144)
Total Protein: 6.6 g/dL (ref 6.0–8.5)
eGFR: 137 mL/min/{1.73_m2} (ref >59)

## 2022-10-27 LAB — CBC/D/PLT+RPR+RH+ABO+RUBIGG...
Antibody Screen: NEGATIVE
Basophils Absolute: 0.1 10*3/uL (ref 0.0–0.2)
Basos: 1 %
EOS (ABSOLUTE): 0.2 10*3/uL (ref 0.0–0.4)
Eos: 1 %
HCV Ab: NONREACTIVE
HIV Screen 4th Generation wRfx: NONREACTIVE
Hematocrit: 32.8 % — ABNORMAL LOW (ref 34.0–46.6)
Hemoglobin: 11 g/dL — ABNORMAL LOW (ref 11.1–15.9)
Hepatitis B Surface Ag: NEGATIVE
Immature Grans (Abs): 0.1 10*3/uL (ref 0.0–0.1)
Immature Granulocytes: 1 %
Lymphocytes Absolute: 2.7 10*3/uL (ref 0.7–3.1)
Lymphs: 23 %
MCH: 30.4 pg (ref 26.6–33.0)
MCHC: 33.5 g/dL (ref 31.5–35.7)
MCV: 91 fL (ref 79–97)
Monocytes Absolute: 1 10*3/uL — ABNORMAL HIGH (ref 0.1–0.9)
Monocytes: 9 %
Neutrophils Absolute: 7.7 10*3/uL — ABNORMAL HIGH (ref 1.4–7.0)
Neutrophils: 65 %
Platelets: 298 10*3/uL (ref 150–450)
RBC: 3.62 x10E6/uL — ABNORMAL LOW (ref 3.77–5.28)
RDW: 11.6 % — ABNORMAL LOW (ref 11.7–15.4)
RPR Ser Ql: NONREACTIVE
Rh Factor: NEGATIVE
Rubella Antibodies, IGG: <0.9 index — ABNORMAL LOW (ref >0.99)
WBC: 11.7 10*3/uL — ABNORMAL HIGH (ref 3.4–10.8)

## 2022-10-27 LAB — HEMOGLOBIN A1C
Est. average glucose Bld gHb Est-mCnc: 111 mg/dL
Hgb A1c MFr Bld: 5.5 % (ref 4.8–5.6)

## 2022-10-27 LAB — HCV INTERPRETATION

## 2022-10-28 LAB — URINE CULTURE, OB REFLEX

## 2022-10-28 LAB — CULTURE, OB URINE

## 2022-10-29 ENCOUNTER — Encounter: Payer: Self-pay | Admitting: General Practice

## 2022-10-31 LAB — PANORAMA PRENATAL TEST FULL PANEL:PANORAMA TEST PLUS 5 ADDITIONAL MICRODELETIONS: FETAL FRACTION: 14.2

## 2022-11-06 LAB — HORIZON CUSTOM: REPORT SUMMARY: POSITIVE — AB

## 2022-11-12 ENCOUNTER — Other Ambulatory Visit: Payer: Self-pay | Admitting: Lactation Services

## 2022-11-12 DIAGNOSIS — Z3492 Encounter for supervision of normal pregnancy, unspecified, second trimester: Secondary | ICD-10-CM

## 2022-11-17 NOTE — Progress Notes (Unsigned)
   PRENATAL VISIT NOTE  Subjective:  Debbie Mccullough is a 27 y.o. G2P1001 at [redacted]w[redacted]d being seen today for ongoing prenatal care.  She is currently monitored for the following issues for this low-risk pregnancy and has Marijuana abuse; Supervision of low-risk pregnancy; Late prenatal care affecting pregnancy in second trimester; Abdominal wall hernia; Rh negative state in antepartum period; and Rubella non-immune status, antepartum on their problem list.  Patient reports {sx:14538}.   .  .   . Denies leaking of fluid.   The following portions of the patient's history were reviewed and updated as appropriate: allergies, current medications, past family history, past medical history, past social history, past surgical history and problem list.   Objective:  There were no vitals filed for this visit.  Fetal Status:           General:  Alert, oriented and cooperative. Patient is in no acute distress.  Skin: Skin is warm and dry. No rash noted.   Cardiovascular: Normal heart rate noted  Respiratory: Normal respiratory effort, no problems with respiration noted  Abdomen: Soft, gravid, appropriate for gestational age.        Pelvic: {Blank single:19197::"Cervical exam performed in the presence of a chaperone","Cervical exam deferred"}        Extremities: Normal range of motion.     Mental Status: Normal mood and affect. Normal behavior. Normal judgment and thought content.   Assessment and Plan:  Pregnancy: G2P1001 at [redacted]w[redacted]d 1. Encounter for supervision of low-risk pregnancy in third trimester ***  2. Rh negative state in antepartum period Rhogam today  3. Rubella non-immune status, antepartum MMRpp  4. [redacted] weeks gestation of pregnancy Anatomy US today  Preterm labor symptoms and general obstetric precautions including but not limited to vaginal bleeding, contractions, leaking of fluid and fetal movement were reviewed in detail with the patient. Please refer to After Visit Summary for  other counseling recommendations.   No follow-ups on file.  Future Appointments  Date Time Provider Department Center  11/19/2022  8:20 AM [facility] LAB [facility]  11/19/2022  9:15 AM Donna Bernard [facility]  11/19/2022 10:15 AM [facility] NURSE [facility]  11/19/2022 10:30 AM [facility] US3 [facility]  12/10/2022  1:15 PM Narka Bing, MD [facility]    Willies Laviolette Autry-Lott, DO

## 2022-11-19 ENCOUNTER — Other Ambulatory Visit: Payer: 59

## 2022-11-19 ENCOUNTER — Other Ambulatory Visit: Payer: Self-pay | Admitting: *Deleted

## 2022-11-19 ENCOUNTER — Encounter: Payer: Self-pay | Admitting: *Deleted

## 2022-11-19 ENCOUNTER — Ambulatory Visit (INDEPENDENT_AMBULATORY_CARE_PROVIDER_SITE_OTHER): Payer: 59 | Admitting: Family Medicine

## 2022-11-19 ENCOUNTER — Ambulatory Visit: Payer: 59 | Attending: Family Medicine

## 2022-11-19 ENCOUNTER — Ambulatory Visit: Payer: 59 | Admitting: *Deleted

## 2022-11-19 VITALS — BP 113/52 | HR 71

## 2022-11-19 VITALS — BP 122/67 | HR 89 | Wt 191.5 lb

## 2022-11-19 DIAGNOSIS — Z23 Encounter for immunization: Secondary | ICD-10-CM

## 2022-11-19 DIAGNOSIS — O26893 Other specified pregnancy related conditions, third trimester: Secondary | ICD-10-CM | POA: Insufficient documentation

## 2022-11-19 DIAGNOSIS — Z3493 Encounter for supervision of normal pregnancy, unspecified, third trimester: Secondary | ICD-10-CM

## 2022-11-19 DIAGNOSIS — O99013 Anemia complicating pregnancy, third trimester: Secondary | ICD-10-CM | POA: Diagnosis not present

## 2022-11-19 DIAGNOSIS — O0933 Supervision of pregnancy with insufficient antenatal care, third trimester: Secondary | ICD-10-CM | POA: Diagnosis not present

## 2022-11-19 DIAGNOSIS — Z6791 Unspecified blood type, Rh negative: Secondary | ICD-10-CM

## 2022-11-19 DIAGNOSIS — F1290 Cannabis use, unspecified, uncomplicated: Secondary | ICD-10-CM | POA: Diagnosis not present

## 2022-11-19 DIAGNOSIS — O99613 Diseases of the digestive system complicating pregnancy, third trimester: Secondary | ICD-10-CM | POA: Diagnosis not present

## 2022-11-19 DIAGNOSIS — Z3A28 28 weeks gestation of pregnancy: Secondary | ICD-10-CM | POA: Insufficient documentation

## 2022-11-19 DIAGNOSIS — O358XX0 Maternal care for other (suspected) fetal abnormality and damage, not applicable or unspecified: Secondary | ICD-10-CM | POA: Insufficient documentation

## 2022-11-19 DIAGNOSIS — O09893 Supervision of other high risk pregnancies, third trimester: Secondary | ICD-10-CM

## 2022-11-19 DIAGNOSIS — O99213 Obesity complicating pregnancy, third trimester: Secondary | ICD-10-CM | POA: Diagnosis not present

## 2022-11-19 DIAGNOSIS — Z148 Genetic carrier of other disease: Secondary | ICD-10-CM | POA: Insufficient documentation

## 2022-11-19 DIAGNOSIS — D563 Thalassemia minor: Secondary | ICD-10-CM

## 2022-11-19 DIAGNOSIS — K469 Unspecified abdominal hernia without obstruction or gangrene: Secondary | ICD-10-CM

## 2022-11-19 DIAGNOSIS — Z3492 Encounter for supervision of normal pregnancy, unspecified, second trimester: Secondary | ICD-10-CM

## 2022-11-19 DIAGNOSIS — O360130 Maternal care for anti-D [Rh] antibodies, third trimester, not applicable or unspecified: Secondary | ICD-10-CM | POA: Diagnosis not present

## 2022-11-19 DIAGNOSIS — O26899 Other specified pregnancy related conditions, unspecified trimester: Secondary | ICD-10-CM

## 2022-11-19 DIAGNOSIS — O99320 Drug use complicating pregnancy, unspecified trimester: Secondary | ICD-10-CM

## 2022-11-19 DIAGNOSIS — O99323 Drug use complicating pregnancy, third trimester: Secondary | ICD-10-CM | POA: Diagnosis not present

## 2022-11-19 DIAGNOSIS — Z2839 Other underimmunization status: Secondary | ICD-10-CM

## 2022-11-19 DIAGNOSIS — O0932 Supervision of pregnancy with insufficient antenatal care, second trimester: Secondary | ICD-10-CM | POA: Insufficient documentation

## 2022-11-19 DIAGNOSIS — E669 Obesity, unspecified: Secondary | ICD-10-CM | POA: Diagnosis not present

## 2022-11-19 MED ORDER — RHO D IMMUNE GLOBULIN 1500 UNIT/2ML IJ SOSY
300.0000 ug | PREFILLED_SYRINGE | Freq: Once | INTRAMUSCULAR | Status: AC
  Administered 2022-11-19: 300 ug via INTRAMUSCULAR

## 2022-11-19 NOTE — Patient Instructions (Addendum)
Ask about vitamin angels

## 2022-11-19 NOTE — Addendum Note (Signed)
Addended by: Coolidge Breeze on: 11/19/2022 09:35 AM   Modules accepted: Orders

## 2022-11-24 ENCOUNTER — Other Ambulatory Visit: Payer: 59

## 2022-11-24 ENCOUNTER — Other Ambulatory Visit: Payer: Self-pay

## 2022-11-24 DIAGNOSIS — Z3493 Encounter for supervision of normal pregnancy, unspecified, third trimester: Secondary | ICD-10-CM

## 2022-11-24 DIAGNOSIS — Z3A28 28 weeks gestation of pregnancy: Secondary | ICD-10-CM

## 2022-12-10 ENCOUNTER — Telehealth (INDEPENDENT_AMBULATORY_CARE_PROVIDER_SITE_OTHER): Payer: 59 | Admitting: Obstetrics and Gynecology

## 2022-12-10 DIAGNOSIS — O0932 Supervision of pregnancy with insufficient antenatal care, second trimester: Secondary | ICD-10-CM

## 2022-12-10 DIAGNOSIS — Z6791 Unspecified blood type, Rh negative: Secondary | ICD-10-CM

## 2022-12-10 DIAGNOSIS — Z3A31 31 weeks gestation of pregnancy: Secondary | ICD-10-CM

## 2022-12-10 DIAGNOSIS — O360130 Maternal care for anti-D [Rh] antibodies, third trimester, not applicable or unspecified: Secondary | ICD-10-CM

## 2022-12-10 DIAGNOSIS — Z3493 Encounter for supervision of normal pregnancy, unspecified, third trimester: Secondary | ICD-10-CM

## 2022-12-10 DIAGNOSIS — O0933 Supervision of pregnancy with insufficient antenatal care, third trimester: Secondary | ICD-10-CM

## 2022-12-10 NOTE — Progress Notes (Signed)
TELEHEALTH OBSTETRICS VISIT ENCOUNTER NOTE  Provider location: Center for [facility] at [facility]   Patient location: Home  I connected with Debbie Mccullough on 12/10/22 at  1:15 PM EDT by telephone at home and verified that I am speaking with the correct person using two identifiers. Of note, unable to do video encounter due to technical difficulties.    I discussed the limitations, risks, security and privacy concerns of performing an evaluation and management service by telephone and the availability of in person appointments. I also discussed with the patient that there may be a patient responsible charge related to this service. The patient expressed understanding and agreed to proceed.  Subjective:  Debbie Mccullough is a 27 y.o. G2P1001 at [redacted]w[redacted]d being followed for ongoing prenatal care.  She is currently monitored for the following issues for this high-risk pregnancy and has Marijuana abuse; Supervision of low-risk pregnancy; Late prenatal care affecting pregnancy in second trimester; Abdominal wall hernia; Rh negative state in antepartum period; and Rubella non-immune status, antepartum on their problem list.  Patient reports no complaints. Reports fetal movement. Denies any contractions, bleeding or leaking of fluid.   The following portions of the patient's history were reviewed and updated as appropriate: allergies, current medications, past family history, past medical history, past social history, past surgical history and problem list.   Objective:  Last menstrual period 05/04/2022. General:  Alert, oriented and cooperative.   Mental Status: Normal mood and affect perceived. Normal judgment and thought content.  Rest of physical exam deferred due to type of encounter  Assessment and Plan:  Pregnancy: G2P1001 at [redacted]w[redacted]d 1. [redacted] weeks gestation of pregnancy Patient needs 28wk labs, GTT. I d/w her re: this and told her that not best to do it on 9/6 u/s  appointment since it's a 2h test and needs to be fasting. I told her to please make a lab only visit If patient doesn't make lab only visit, can do non fasting 1h GTT  Nexplanon; d/w her re: doing it in patient 8/1: 28%, 1186g, ac 24%, afi 15  I told her that she can come by at any time to pick up a free BP cuff  2. Late prenatal care affecting pregnancy in second trimester  3. Rh negative state in antepartum period S/p rhogam on 8/1  4. Encounter for supervision of low-risk pregnancy in third trimester  Preterm labor symptoms and general obstetric precautions including but not limited to vaginal bleeding, contractions, leaking of fluid and fetal movement were reviewed in detail with the patient.  I discussed the assessment and treatment plan with the patient. The patient was provided an opportunity to ask questions and all were answered. The patient agreed with the plan and demonstrated an understanding of the instructions. The patient was advised to call back or seek an in-person office evaluation/go to [facility]U at [facility] for any urgent or concerning symptoms. Please refer to After Visit Summary for other counseling recommendations.   I provided 10 minutes of non-face-to-face time during this encounter.  No follow-ups on file.  Future Appointments  Date Time Provider Department Center  12/25/2022  8:30 AM [facility] NURSE [facility]  12/25/2022  8:45 AM [facility] US4 [facility]  12/25/2022 10:15 AM [place] Bing, MD [facility]  01/11/2023 10:55 AM Wright Bing, MD [facility]  01/18/2023 10:55 AM Adam Phenix, MD [facility]    Lovejoy Bing, MD Center for [facility], [facility]

## 2022-12-10 NOTE — Progress Notes (Signed)
Pt states does not have funds to purchase a BP Cuff .Marland KitchenMarland Kitchen

## 2022-12-25 ENCOUNTER — Ambulatory Visit: Payer: 59 | Admitting: *Deleted

## 2022-12-25 ENCOUNTER — Ambulatory Visit: Payer: 59 | Attending: Obstetrics

## 2022-12-25 ENCOUNTER — Ambulatory Visit (INDEPENDENT_AMBULATORY_CARE_PROVIDER_SITE_OTHER): Payer: 59 | Admitting: Obstetrics and Gynecology

## 2022-12-25 VITALS — BP 136/81 | HR 79

## 2022-12-25 VITALS — BP 136/81 | HR 79 | Wt 184.8 lb

## 2022-12-25 DIAGNOSIS — O2343 Unspecified infection of urinary tract in pregnancy, third trimester: Secondary | ICD-10-CM

## 2022-12-25 DIAGNOSIS — O0932 Supervision of pregnancy with insufficient antenatal care, second trimester: Secondary | ICD-10-CM

## 2022-12-25 DIAGNOSIS — E669 Obesity, unspecified: Secondary | ICD-10-CM

## 2022-12-25 DIAGNOSIS — O358XX0 Maternal care for other (suspected) fetal abnormality and damage, not applicable or unspecified: Secondary | ICD-10-CM | POA: Diagnosis not present

## 2022-12-25 DIAGNOSIS — O26893 Other specified pregnancy related conditions, third trimester: Secondary | ICD-10-CM | POA: Diagnosis not present

## 2022-12-25 DIAGNOSIS — O360130 Maternal care for anti-D [Rh] antibodies, third trimester, not applicable or unspecified: Secondary | ICD-10-CM

## 2022-12-25 DIAGNOSIS — Z6791 Unspecified blood type, Rh negative: Secondary | ICD-10-CM | POA: Diagnosis not present

## 2022-12-25 DIAGNOSIS — O99013 Anemia complicating pregnancy, third trimester: Secondary | ICD-10-CM

## 2022-12-25 DIAGNOSIS — Z2839 Other underimmunization status: Secondary | ICD-10-CM

## 2022-12-25 DIAGNOSIS — Z148 Genetic carrier of other disease: Secondary | ICD-10-CM | POA: Diagnosis not present

## 2022-12-25 DIAGNOSIS — D563 Thalassemia minor: Secondary | ICD-10-CM

## 2022-12-25 DIAGNOSIS — O26899 Other specified pregnancy related conditions, unspecified trimester: Secondary | ICD-10-CM

## 2022-12-25 DIAGNOSIS — R3989 Other symptoms and signs involving the genitourinary system: Secondary | ICD-10-CM

## 2022-12-25 DIAGNOSIS — Z3A33 33 weeks gestation of pregnancy: Secondary | ICD-10-CM | POA: Insufficient documentation

## 2022-12-25 DIAGNOSIS — Z3493 Encounter for supervision of normal pregnancy, unspecified, third trimester: Secondary | ICD-10-CM | POA: Diagnosis not present

## 2022-12-25 DIAGNOSIS — O99320 Drug use complicating pregnancy, unspecified trimester: Secondary | ICD-10-CM | POA: Insufficient documentation

## 2022-12-25 DIAGNOSIS — F1290 Cannabis use, unspecified, uncomplicated: Secondary | ICD-10-CM | POA: Insufficient documentation

## 2022-12-25 DIAGNOSIS — O09899 Supervision of other high risk pregnancies, unspecified trimester: Secondary | ICD-10-CM | POA: Insufficient documentation

## 2022-12-25 DIAGNOSIS — O99213 Obesity complicating pregnancy, third trimester: Secondary | ICD-10-CM | POA: Insufficient documentation

## 2022-12-25 DIAGNOSIS — O0933 Supervision of pregnancy with insufficient antenatal care, third trimester: Secondary | ICD-10-CM | POA: Insufficient documentation

## 2022-12-25 MED ORDER — CEFADROXIL 500 MG PO CAPS: 500.0000 mg | ORAL_CAPSULE | Freq: Two times a day (BID) | ORAL | 0 refills | Status: DC

## 2022-12-25 NOTE — Progress Notes (Signed)
Pt concerned of orange Urine color

## 2022-12-25 NOTE — Patient Instructions (Signed)
Continue to check your blood pressure three times a week  Call the office for blood pressures that are consistently above 140 for the top number or 90 for the bottom number

## 2022-12-26 LAB — CBC
Hematocrit: 38.1 % (ref 34.0–46.6)
Hemoglobin: 12.4 g/dL (ref 11.1–15.9)
MCH: 29.2 pg (ref 26.6–33.0)
MCHC: 32.5 g/dL (ref 31.5–35.7)
MCV: 90 fL (ref 79–97)
Platelets: 310 10*3/uL (ref 150–450)
RBC: 4.24 x10E6/uL (ref 3.77–5.28)
RDW: 12.3 % (ref 11.7–15.4)
WBC: 8.4 10*3/uL (ref 3.4–10.8)

## 2022-12-26 LAB — RPR: RPR Ser Ql: NONREACTIVE

## 2022-12-26 LAB — HIV ANTIBODY (ROUTINE TESTING W REFLEX): HIV Screen 4th Generation wRfx: NONREACTIVE

## 2022-12-26 LAB — GLUCOSE, 1 HOUR GESTATIONAL: Gestational Diabetes Screen: 241 mg/dL — ABNORMAL HIGH (ref 70–139)

## 2022-12-27 LAB — URINE CULTURE, OB REFLEX

## 2022-12-27 LAB — CULTURE, OB URINE

## 2022-12-28 LAB — POCT URINALYSIS DIP (DEVICE)
Glucose, UA: NEGATIVE mg/dL
Hgb urine dipstick: NEGATIVE
Ketones, ur: 80 mg/dL — AB
Nitrite: NEGATIVE
Protein, ur: 30 mg/dL — AB
Specific Gravity, Urine: >=1.03 (ref 1.005–1.030)
Urobilinogen, UA: >=8 mg/dL (ref 0.0–1.0)
pH: 6 (ref 5.0–8.0)

## 2022-12-28 NOTE — Progress Notes (Signed)
   PRENATAL VISIT NOTE  Subjective:  Debbie Mccullough is a 27 y.o. G2P1001 at [redacted]w[redacted]d being seen today for ongoing prenatal care.  She is currently monitored for the following issues for this low-risk pregnancy and has Urinary tract infection in mother during third trimester of pregnancy; Marijuana abuse; Supervision of low-risk pregnancy; Late prenatal care affecting pregnancy in second trimester; Abdominal wall hernia; Rh negative state in antepartum period; and Rubella non-immune status, antepartum on their problem list.  Patient reports  urine looks orange, no lower urinary tract s/s .  Contractions: Not present. Vag. Bleeding: None.  Movement: Present. Denies leaking of fluid.   The following portions of the patient's history were reviewed and updated as appropriate: allergies, current medications, past family history, past medical history, past social history, past surgical history and problem list.   Objective:   Vitals:   12/25/22 1027  BP: 136/81  Pulse: 79  Weight: 184 lb 12.8 oz (83.8 kg)    Fetal Status: Fetal Heart Rate (bpm): 155 Fundal Height: 34 cm Movement: Present  Presentation: Vertex  General:  Alert, oriented and cooperative. Patient is in no acute distress.  Skin: Skin is warm and dry. No rash noted.   Cardiovascular: Normal heart rate noted  Respiratory: Normal respiratory effort, no problems with respiration noted  Abdomen: Soft, gravid, appropriate for gestational age.  Pain/Pressure: Absent     Pelvic: Cervical exam deferred        Extremities: Normal range of motion.     Mental Status: Normal mood and affect. Normal behavior. Normal judgment and thought content.   Assessment and Plan:  Pregnancy: G2P1001 at [redacted]w[redacted]d 1. Encounter for supervision of low-risk pregnancy in third trimester 28wk labs today. 9/6 growth 25%, 2085gm, ac 43%, afi 19, cephalic, repeat PRN - Glucose tolerance, 1 hour - CBC - HIV Antibody (routine testing w rflx) - RPR - Glucose, 1  hour - CBC - HIV Antibody (routine testing w rflx) - RPR  2. Abnormal urine color +nit on u/a. Duricef sent in  - Culture, OB Urine  3. Urinary tract infection in mother during third trimester of pregnancy  4. Rh negative state in antepartum period S/p rhogam on 8/1  Preterm labor symptoms and general obstetric precautions including but not limited to vaginal bleeding, contractions, leaking of fluid and fetal movement were reviewed in detail with the patient. Please refer to After Visit Summary for other counseling recommendations.   Return in about 3 weeks (around 01/15/2023) for in person, low risk ob, md or app.  Future Appointments  Date Time Provider Department Center  01/11/2023 10:55 AM [place] Bing, MD [facility]  01/18/2023 10:55 AM Adam Phenix, MD [facility]    Reedley Bing, MD

## 2022-12-29 ENCOUNTER — Encounter: Payer: Self-pay | Admitting: Obstetrics and Gynecology

## 2022-12-30 ENCOUNTER — Telehealth: Payer: Self-pay

## 2022-12-30 DIAGNOSIS — O24419 Gestational diabetes mellitus in pregnancy, unspecified control: Secondary | ICD-10-CM

## 2022-12-30 MED ORDER — ACCU-CHEK SOFTCLIX LANCETS MISC: 12 refills | Status: AC

## 2022-12-30 MED ORDER — ACCU-CHEK GUIDE W/DEVICE KIT: 1.0000 | PACK | Freq: Four times a day (QID) | 0 refills | Status: AC

## 2022-12-30 MED ORDER — GLUCOSE BLOOD VI STRP: ORAL_STRIP | 12 refills | Status: AC

## 2022-12-30 NOTE — Telephone Encounter (Signed)
Call placed to pt. Spoke with pt. Pt given results and recommendations per Dr Vergie Living.  Pt verbalized understanding. Pt set up with weekly BPPs, Diabetes ED on 9/24 and supplies sent to pharmacy. Pt agreeable to plan of care.  Judeth Cornfield, RNC

## 2022-12-30 NOTE — Telephone Encounter (Signed)
-----   Message from [place] sent at 12/29/2022  8:51 PM EDT ----- Needs asap GDM classes, supplies, teaching, etc. Thanks Also, please set her up with weekly BPPs in our office starting this week. Thanks

## 2022-12-31 ENCOUNTER — Other Ambulatory Visit: Payer: Self-pay

## 2022-12-31 ENCOUNTER — Other Ambulatory Visit: Payer: 59

## 2023-01-07 ENCOUNTER — Ambulatory Visit (INDEPENDENT_AMBULATORY_CARE_PROVIDER_SITE_OTHER): Payer: 59

## 2023-01-07 ENCOUNTER — Other Ambulatory Visit: Payer: Self-pay

## 2023-01-07 DIAGNOSIS — Z3A35 35 weeks gestation of pregnancy: Secondary | ICD-10-CM

## 2023-01-07 DIAGNOSIS — O24419 Gestational diabetes mellitus in pregnancy, unspecified control: Secondary | ICD-10-CM

## 2023-01-11 ENCOUNTER — Telehealth (INDEPENDENT_AMBULATORY_CARE_PROVIDER_SITE_OTHER): Payer: 59 | Admitting: Obstetrics and Gynecology

## 2023-01-11 VITALS — BP 137/73 | HR 63

## 2023-01-11 DIAGNOSIS — O0932 Supervision of pregnancy with insufficient antenatal care, second trimester: Secondary | ICD-10-CM

## 2023-01-11 DIAGNOSIS — Z6791 Unspecified blood type, Rh negative: Secondary | ICD-10-CM

## 2023-01-11 DIAGNOSIS — O26899 Other specified pregnancy related conditions, unspecified trimester: Secondary | ICD-10-CM

## 2023-01-11 DIAGNOSIS — Z3A36 36 weeks gestation of pregnancy: Secondary | ICD-10-CM

## 2023-01-11 DIAGNOSIS — O2343 Unspecified infection of urinary tract in pregnancy, third trimester: Secondary | ICD-10-CM

## 2023-01-11 DIAGNOSIS — O0933 Supervision of pregnancy with insufficient antenatal care, third trimester: Secondary | ICD-10-CM

## 2023-01-11 DIAGNOSIS — O24419 Gestational diabetes mellitus in pregnancy, unspecified control: Secondary | ICD-10-CM

## 2023-01-11 DIAGNOSIS — O26893 Other specified pregnancy related conditions, third trimester: Secondary | ICD-10-CM

## 2023-01-11 NOTE — Progress Notes (Signed)
TELEHEALTH OBSTETRICS VISIT ENCOUNTER NOTE  Provider location: Center for [facility]   Patient location: Home  I connected with Debbie Mccullough on 01/11/23 at 10:55 AM EDT by telephone at home and verified that I am speaking with the correct person using two identifiers. Of note, unable to do video encounter due to technical difficulties.    I discussed the limitations, risks, security and privacy concerns of performing an evaluation and management service by telephone and the availability of in person appointments. I also discussed with the patient that there may be a patient responsible charge related to this service. The patient expressed understanding and agreed to proceed.  Subjective:  Debbie Mccullough is a 27 y.o. G2P1001 at [redacted]w[redacted]d being followed for ongoing prenatal care.  She is currently monitored for the following issues for this high-risk pregnancy and has Urinary tract infection in mother during third trimester of pregnancy; GDM (gestational diabetes mellitus); Marijuana abuse; Supervision of low-risk pregnancy; Late prenatal care affecting pregnancy in second trimester; Abdominal wall hernia; Rh negative state in antepartum period; and Rubella non-immune status, antepartum on their problem list.  Patient reports  occasional low back discomfort . Reports fetal movement. Denies any contractions, bleeding or leaking of fluid.   The following portions of the patient's history were reviewed and updated as appropriate: allergies, current medications, past family history, past medical history, past social history, past surgical history and problem list.   Objective:  Blood pressure 137/73, pulse 63, last menstrual period 05/04/2022. General:  Alert, oriented and cooperative.   Mental Status: Normal mood and affect perceived. Normal judgment and thought content.  Rest of physical exam deferred due to type of encounter  Assessment and Plan:   Pregnancy: G2P1001 at [redacted]w[redacted]d 1. [redacted] weeks gestation of pregnancy Needs swabs next visit Confirm birth control plan next visit  2. Gestational diabetes mellitus (GDM) in third trimester, gestational diabetes method of control unspecified Hasn't picked up supplies; will try and go today. She has 1st appointment tomorrow which I confirmed with her today. Pt states it's to be a virtual appointment. Pt has h/o GDM in prior pregnancy.  I told her that delivery timing will be based on CBG control Continue weekly testing for now for unknown CBG control; pt states she can make it to 9/26 visit>>front desk asked to make ROB on same day 9/19: cephalic, afi 18, bpp 8/8 9/6: 25%, 2085gm, ac 43%, afi 19, cephalic  3. Rh negative state in antepartum period S/p rhogam on 8/1  4. Late prenatal care affecting pregnancy in second trimester  5. Urinary tract infection in mother during third trimester of pregnancy Toc neg  Preterm labor symptoms and general obstetric precautions including but not limited to vaginal bleeding, contractions, leaking of fluid and fetal movement were reviewed in detail with the patient.  I discussed the assessment and treatment plan with the patient. The patient was provided an opportunity to ask questions and all were answered. The patient agreed with the plan and demonstrated an understanding of the instructions. The patient was advised to call back or seek an in-person office evaluation/go to [facility] at [facility] for any urgent or concerning symptoms. Please refer to After Visit Summary for other counseling recommendations.   I provided 10 minutes of non-face-to-face time during this encounter.  Return in 3 days (on 01/14/2023) for in person, high risk ob, md or app;Pt has transportation issues. Needs OB visit on same day as u/s. Marland Kitchen  Future Appointments  Date Time Provider Department Center  01/12/2023  3:15 PM [facility]  01/14/2023 10:45 AM [facility]  [facility]  01/18/2023 10:55 AM Adam Phenix, MD [facility]    [place] Bing, MD Center for [facility], [facility]

## 2023-01-12 ENCOUNTER — Encounter: Payer: 59 | Attending: Obstetrics and Gynecology | Admitting: Dietician

## 2023-01-12 ENCOUNTER — Telehealth (INDEPENDENT_AMBULATORY_CARE_PROVIDER_SITE_OTHER): Payer: 59 | Admitting: Dietician

## 2023-01-12 ENCOUNTER — Other Ambulatory Visit: Payer: Self-pay

## 2023-01-12 DIAGNOSIS — O24419 Gestational diabetes mellitus in pregnancy, unspecified control: Secondary | ICD-10-CM

## 2023-01-12 DIAGNOSIS — Z3A36 36 weeks gestation of pregnancy: Secondary | ICD-10-CM | POA: Diagnosis not present

## 2023-01-12 DIAGNOSIS — Z713 Dietary counseling and surveillance: Secondary | ICD-10-CM | POA: Insufficient documentation

## 2023-01-12 DIAGNOSIS — Z3493 Encounter for supervision of normal pregnancy, unspecified, third trimester: Secondary | ICD-10-CM

## 2023-01-12 DIAGNOSIS — O24410 Gestational diabetes mellitus in pregnancy, diet controlled: Secondary | ICD-10-CM | POA: Diagnosis not present

## 2023-01-12 NOTE — Progress Notes (Signed)
Patient was seen for Gestational Diabetes self-management on 01/12/23  Start time 1530 and End time 1600  This was a VIRTUAL visit due to no transpiration. Patient is at her home and I am at my office.  Estimated due date: 02/08/2023 or sooner; [redacted]w[redacted]d  Clinical: Medications: prenatal vitamin, see list Medical History: GDM currently and with first pregnancy Labs:  A1c 5.5% on 10/26/2022   Dietary and Lifestyle History: Patient lives with her 28 yo daughter. She is currently not employed.   She does receive food stamps.  She has a [facility] appointment 01/20/2023. Adequate access to food. She currently has [employer] but has reapplied for Medicaid.  Physical Activity: She walks most days for 20-30 minutes Stress: normal Sleep: uncomfortable  24 hr Recall:  First Meal: eggs, ham, pineapple Snack: Second meal:  sandwich and chips Snack:  chips Third meal:  chicken and rice, green beans Snack: Beverages:  water  NUTRITION INTERVENTION  Nutrition education (E-1) on the following topics:   Initial Follow-up  [x]  []  Definition of Gestational Diabetes [x]  []  Why dietary management is important in controlling blood glucose [x]  []  Effects each nutrient has on blood glucose levels [x]  []  Simple carbohydrates vs complex carbohydrates [x]  []  Fluid intake [x]  []  Creating a balanced meal plan []  []  Carbohydrate counting  [x]  []  When to check blood glucose levels [x]  []  Proper blood glucose monitoring techniques [x]  []  Effect of stress and stress reduction techniques  [x]  []  Exercise effect on blood glucose levels, appropriate exercise during pregnancy []  []  Importance of limiting caffeine and abstaining from alcohol and smoking []  []  Medications used for blood sugar control during pregnancy []  []  Hypoglycemia and rule of 15 []  []  Postpartum self care   Patient has a prescription for an Accu Check meter.  She has not picked this up as she states that insurance will not cover it.  [employer] should cover the strips and lancets but may require a copay.  She is applying for Medicaid again.  Provided patient with codes to give to the pharmacy to get a new blood glucose meter for free.  She knows how to check her blood glucose as she did this during her previous pregnancy.   Patient instructed to monitor glucose levels: FBS: 60 - <= 95 mg/dL; 2 hour: <= 478 mg/dL  Patient received handouts: Nutrition Diabetes and Pregnancy Carbohydrate Counting List  Patient will be seen for follow-up as needed.

## 2023-01-14 ENCOUNTER — Ambulatory Visit (INDEPENDENT_AMBULATORY_CARE_PROVIDER_SITE_OTHER): Payer: 59

## 2023-01-14 ENCOUNTER — Other Ambulatory Visit: Payer: Self-pay | Admitting: *Deleted

## 2023-01-14 ENCOUNTER — Other Ambulatory Visit (HOSPITAL_COMMUNITY)
Admission: RE | Admit: 2023-01-14 | Discharge: 2023-01-14 | Disposition: A | Discharge status: Home / Self Care | Payer: 59 | Source: Ambulatory Visit | Attending: Obstetrics and Gynecology | Admitting: Obstetrics and Gynecology

## 2023-01-14 ENCOUNTER — Ambulatory Visit (INDEPENDENT_AMBULATORY_CARE_PROVIDER_SITE_OTHER): Payer: 59 | Admitting: Obstetrics and Gynecology

## 2023-01-14 ENCOUNTER — Other Ambulatory Visit: Payer: Self-pay

## 2023-01-14 ENCOUNTER — Encounter: Payer: Self-pay | Admitting: Obstetrics and Gynecology

## 2023-01-14 VITALS — BP 118/63 | HR 83

## 2023-01-14 DIAGNOSIS — O26899 Other specified pregnancy related conditions, unspecified trimester: Secondary | ICD-10-CM

## 2023-01-14 DIAGNOSIS — Z3493 Encounter for supervision of normal pregnancy, unspecified, third trimester: Secondary | ICD-10-CM | POA: Diagnosis not present

## 2023-01-14 DIAGNOSIS — Z2839 Other underimmunization status: Secondary | ICD-10-CM

## 2023-01-14 DIAGNOSIS — Z3A36 36 weeks gestation of pregnancy: Secondary | ICD-10-CM

## 2023-01-14 DIAGNOSIS — O24419 Gestational diabetes mellitus in pregnancy, unspecified control: Secondary | ICD-10-CM | POA: Diagnosis not present

## 2023-01-14 DIAGNOSIS — O09899 Supervision of other high risk pregnancies, unspecified trimester: Secondary | ICD-10-CM

## 2023-01-14 DIAGNOSIS — Z6791 Unspecified blood type, Rh negative: Secondary | ICD-10-CM

## 2023-01-14 DIAGNOSIS — O24410 Gestational diabetes mellitus in pregnancy, diet controlled: Secondary | ICD-10-CM

## 2023-01-14 LAB — GLUCOSE, CAPILLARY: Glucose-Capillary: 113 mg/dL — ABNORMAL HIGH (ref 70–99)

## 2023-01-14 NOTE — Progress Notes (Signed)
Subjective:  Debbie Mccullough is a 27 y.o. G2P1001 at [redacted]w[redacted]d being seen today for ongoing prenatal care.  She is currently monitored for the following issues for this high-risk pregnancy and has GDM (gestational diabetes mellitus); Marijuana abuse; Supervision of low-risk pregnancy; Late prenatal care affecting pregnancy in second trimester; Abdominal wall hernia; Rh negative state in antepartum period; and Rubella non-immune status, antepartum on their problem list.  Patient reports  general discomforts of pregnancy .  Contractions: Irritability. Vag. Bleeding: None.  Movement: Present. Denies leaking of fluid.   The following portions of the patient's history were reviewed and updated as appropriate: allergies, current medications, past family history, past medical history, past social history, past surgical history and problem list. Problem list updated.  Objective:   Vitals:   01/14/23 1144  BP: 118/63  Pulse: 83    Fetal Status:     Movement: Present     General:  Alert, oriented and cooperative. Patient is in no acute distress.  Skin: Skin is warm and dry. No rash noted.   Cardiovascular: Normal heart rate noted  Respiratory: Normal respiratory effort, no problems with respiration noted  Abdomen: Soft, gravid, appropriate for gestational age. Pain/Pressure: Present     Pelvic:  Cervical exam performed        Extremities: Normal range of motion.  Edema: Trace  Mental Status: Normal mood and affect. Normal behavior. Normal judgment and thought content.   Urinalysis:      Assessment and Plan:  Pregnancy: G2P1001 at [redacted]w[redacted]d  1. Encounter for supervision of low-risk pregnancy in third trimester Stable - GC/Chlamydia probe amp ()not at [facility] - Culture, beta strep (group b only)  2. Diet controlled gestational diabetes mellitus (GDM) in third trimester Glucometer provided to pt today Use reviewed with pt CBG's goals reviewed with pt BPP 8/8 today Growth 25 % on  12/25/22 CBG in office today, fasting 113 Will see what CBG's are at next appt. Consider IOL as per CBG results  3. Rubella non-immune status, antepartum Vaccine PP  4. Rh negative state in antepartum period S/P Rhogam  Term labor symptoms and general obstetric precautions including but not limited to vaginal bleeding, contractions, leaking of fluid and fetal movement were reviewed in detail with the patient. Please refer to After Visit Summary for other counseling recommendations.  Return in about 1 week (around 01/21/2023) for OB visit, face to face, MD only.   Hermina Staggers, MD

## 2023-01-15 LAB — GC/CHLAMYDIA PROBE AMP (~~LOC~~) NOT AT ARMC
Chlamydia: NEGATIVE
Comment: NEGATIVE
Comment: NORMAL
Neisseria Gonorrhea: NEGATIVE

## 2023-01-17 LAB — CULTURE, BETA STREP (GROUP B ONLY): Strep Gp B Culture: POSITIVE — AB

## 2023-01-18 ENCOUNTER — Encounter: Payer: Self-pay | Admitting: Obstetrics and Gynecology

## 2023-01-18 ENCOUNTER — Encounter: Payer: 59 | Admitting: Obstetrics & Gynecology

## 2023-01-18 DIAGNOSIS — O99820 Streptococcus B carrier state complicating pregnancy: Secondary | ICD-10-CM | POA: Insufficient documentation

## 2023-01-21 ENCOUNTER — Other Ambulatory Visit: Payer: 59

## 2023-01-21 ENCOUNTER — Encounter: Payer: 59 | Admitting: Obstetrics and Gynecology

## 2023-01-25 ENCOUNTER — Ambulatory Visit (INDEPENDENT_AMBULATORY_CARE_PROVIDER_SITE_OTHER): Payer: 59 | Admitting: Obstetrics and Gynecology

## 2023-01-25 ENCOUNTER — Other Ambulatory Visit: Payer: Self-pay

## 2023-01-25 VITALS — BP 123/63 | HR 71 | Wt 197.2 lb

## 2023-01-25 DIAGNOSIS — O24415 Gestational diabetes mellitus in pregnancy, controlled by oral hypoglycemic drugs: Secondary | ICD-10-CM

## 2023-01-25 DIAGNOSIS — O26899 Other specified pregnancy related conditions, unspecified trimester: Secondary | ICD-10-CM

## 2023-01-25 DIAGNOSIS — Z3493 Encounter for supervision of normal pregnancy, unspecified, third trimester: Secondary | ICD-10-CM

## 2023-01-25 DIAGNOSIS — Z2839 Other underimmunization status: Secondary | ICD-10-CM

## 2023-01-25 DIAGNOSIS — O09893 Supervision of other high risk pregnancies, third trimester: Secondary | ICD-10-CM

## 2023-01-25 DIAGNOSIS — Z6791 Unspecified blood type, Rh negative: Secondary | ICD-10-CM

## 2023-01-25 DIAGNOSIS — O26893 Other specified pregnancy related conditions, third trimester: Secondary | ICD-10-CM

## 2023-01-25 DIAGNOSIS — O99820 Streptococcus B carrier state complicating pregnancy: Secondary | ICD-10-CM

## 2023-01-25 DIAGNOSIS — Z3A38 38 weeks gestation of pregnancy: Secondary | ICD-10-CM

## 2023-01-25 MED ORDER — METFORMIN HCL 500 MG PO TABS
500.0000 mg | ORAL_TABLET | Freq: Two times a day (BID) | ORAL | 5 refills | Status: AC
Start: 2023-01-25 — End: ?

## 2023-01-25 NOTE — Progress Notes (Signed)
   PRENATAL VISIT NOTE  Subjective:  Debbie Mccullough is a 27 y.o. G2P1001 at [redacted]w[redacted]d being seen today for ongoing prenatal care.  She is currently monitored for the following issues for this low-risk pregnancy and has GDM (gestational diabetes mellitus); Marijuana abuse; Supervision of low-risk pregnancy; Late prenatal care affecting pregnancy in second trimester; Abdominal wall hernia; Rh negative state in antepartum period; Rubella non-immune status, antepartum; and GBS (group B Streptococcus carrier), +RV culture, currently pregnant on their problem list.  Patient reports intermittent pelvic pressure Contractions: Irritability. Vag. Bleeding: None.  Movement: Present. Denies leaking of fluid.   The following portions of the patient's history were reviewed and updated as appropriate: allergies, current medications, past family history, past medical history, past social history, past surgical history and problem list.   Objective:   Vitals:   01/25/23 0825  BP: 123/63  Pulse: 71  Weight: 197 lb 3.2 oz (89.4 kg)    Fetal Status: Fetal Heart Rate (bpm): 135   Movement: Present     General:  Alert, oriented and cooperative. Patient is in no acute distress.  Skin: Skin is warm and dry. No rash noted.   Cardiovascular: Normal heart rate noted  Respiratory: Normal respiratory effort, no problems with respiration noted  Abdomen: Soft, gravid, appropriate for gestational age.  Pain/Pressure: Present     Pelvic: Cervical exam deferred        Extremities: Normal range of motion.  Edema: Trace  Mental Status: Normal mood and affect. Normal behavior. Normal judgment and thought content.   Assessment and Plan:  Pregnancy: G2P1001 at [redacted]w[redacted]d 1. Encounter for supervision of low-risk pregnancy in third trimester BP and FHR normal   2. Gestational diabetes mellitus (GDM) in third trimester controlled on oral hypoglycemic drug [place] majority >95, pp majority > 120, discussed initiating metformin  to start today, continue dietary changes Scheduling IOL today Hasn't been scheduled for BPP, will schedule today  3. Rubella non-immune status, antepartum MMR pp  4. Rh negative state in antepartum period S/p rhogam  5. [redacted] weeks gestation of pregnancy Labor precautions discussed  6. GBS (group B Streptococcus carrier), +RV culture, currently pregnant Tx in labor    Term labor symptoms and general obstetric precautions including but not limited to vaginal bleeding, contractions, leaking of fluid and fetal movement were reviewed in detail with the patient. Please refer to After Visit Summary for other counseling recommendations.   Future Appointments  Date Time Provider Department Center  02/01/2023 12:00 AM [facility] SCHED ROOM [facility] None  02/01/2023 11:15 AM Sue Lush, FNP [facility]     Albertine Grates, FNP

## 2023-01-26 ENCOUNTER — Telehealth (HOSPITAL_COMMUNITY): Payer: Self-pay | Admitting: *Deleted

## 2023-01-26 ENCOUNTER — Encounter (HOSPITAL_COMMUNITY): Payer: Self-pay | Admitting: *Deleted

## 2023-01-26 NOTE — Telephone Encounter (Signed)
Preadmission screen  

## 2023-01-27 ENCOUNTER — Other Ambulatory Visit: Payer: 59

## 2023-01-27 ENCOUNTER — Other Ambulatory Visit: Payer: Self-pay | Admitting: Advanced Practice Midwife

## 2023-01-28 ENCOUNTER — Encounter (HOSPITAL_COMMUNITY): Payer: Self-pay | Admitting: Obstetrics & Gynecology

## 2023-01-28 ENCOUNTER — Ambulatory Visit (INDEPENDENT_AMBULATORY_CARE_PROVIDER_SITE_OTHER): Payer: 59

## 2023-01-28 ENCOUNTER — Inpatient Hospital Stay (HOSPITAL_COMMUNITY)
Admission: AD | Admit: 2023-01-28 | Discharge: 2023-01-30 | DRG: 807 | Disposition: A | Discharge status: Home / Self Care | Payer: 59 | Attending: Family Medicine | Admitting: Family Medicine

## 2023-01-28 ENCOUNTER — Inpatient Hospital Stay (HOSPITAL_COMMUNITY): Payer: 59 | Admitting: Anesthesiology

## 2023-01-28 ENCOUNTER — Other Ambulatory Visit: Payer: Self-pay

## 2023-01-28 DIAGNOSIS — Z30017 Encounter for initial prescription of implantable subdermal contraceptive: Secondary | ICD-10-CM | POA: Diagnosis not present

## 2023-01-28 DIAGNOSIS — Z5982 Transportation insecurity: Secondary | ICD-10-CM

## 2023-01-28 DIAGNOSIS — O24419 Gestational diabetes mellitus in pregnancy, unspecified control: Principal | ICD-10-CM | POA: Diagnosis present

## 2023-01-28 DIAGNOSIS — Z7982 Long term (current) use of aspirin: Secondary | ICD-10-CM | POA: Diagnosis not present

## 2023-01-28 DIAGNOSIS — O24420 Gestational diabetes mellitus in childbirth, diet controlled: Secondary | ICD-10-CM | POA: Diagnosis not present

## 2023-01-28 DIAGNOSIS — O24410 Gestational diabetes mellitus in pregnancy, diet controlled: Secondary | ICD-10-CM | POA: Diagnosis not present

## 2023-01-28 DIAGNOSIS — Z6791 Unspecified blood type, Rh negative: Secondary | ICD-10-CM

## 2023-01-28 DIAGNOSIS — O99820 Streptococcus B carrier state complicating pregnancy: Secondary | ICD-10-CM | POA: Diagnosis not present

## 2023-01-28 DIAGNOSIS — Z8279 Family history of other congenital malformations, deformations and chromosomal abnormalities: Secondary | ICD-10-CM

## 2023-01-28 DIAGNOSIS — Z2839 Other underimmunization status: Secondary | ICD-10-CM

## 2023-01-28 DIAGNOSIS — O24425 Gestational diabetes mellitus in childbirth, controlled by oral hypoglycemic drugs: Secondary | ICD-10-CM | POA: Diagnosis not present

## 2023-01-28 DIAGNOSIS — O99824 Streptococcus B carrier state complicating childbirth: Secondary | ICD-10-CM | POA: Diagnosis present

## 2023-01-28 DIAGNOSIS — O26893 Other specified pregnancy related conditions, third trimester: Principal | ICD-10-CM | POA: Diagnosis present

## 2023-01-28 DIAGNOSIS — Z3A38 38 weeks gestation of pregnancy: Secondary | ICD-10-CM

## 2023-01-28 DIAGNOSIS — Z87891 Personal history of nicotine dependence: Secondary | ICD-10-CM

## 2023-01-28 DIAGNOSIS — Z2989 Encounter for other specified prophylactic measures: Secondary | ICD-10-CM | POA: Diagnosis not present

## 2023-01-28 DIAGNOSIS — Z833 Family history of diabetes mellitus: Secondary | ICD-10-CM

## 2023-01-28 DIAGNOSIS — Z3493 Encounter for supervision of normal pregnancy, unspecified, third trimester: Secondary | ICD-10-CM

## 2023-01-28 DIAGNOSIS — Z3490 Encounter for supervision of normal pregnancy, unspecified, unspecified trimester: Secondary | ICD-10-CM

## 2023-01-28 DIAGNOSIS — O09899 Supervision of other high risk pregnancies, unspecified trimester: Secondary | ICD-10-CM

## 2023-01-28 DIAGNOSIS — O99324 Drug use complicating childbirth: Secondary | ICD-10-CM | POA: Diagnosis not present

## 2023-01-28 DIAGNOSIS — Z23 Encounter for immunization: Secondary | ICD-10-CM | POA: Diagnosis not present

## 2023-01-28 DIAGNOSIS — O26899 Other specified pregnancy related conditions, unspecified trimester: Secondary | ICD-10-CM

## 2023-01-28 DIAGNOSIS — Z0542 Observation and evaluation of newborn for suspected metabolic condition ruled out: Secondary | ICD-10-CM | POA: Diagnosis not present

## 2023-01-28 HISTORY — DX: Gestational (pregnancy-induced) hypertension without significant proteinuria, unspecified trimester: O13.9

## 2023-01-28 LAB — CBC
HCT: 38.1 % (ref 36.0–46.0)
HCT: 38.1 % (ref 36.0–46.0)
Hemoglobin: 12.5 g/dL (ref 12.0–15.0)
Hemoglobin: 12.3 g/dL (ref 12.0–15.0)
MCH: 30.5 pg (ref 26.0–34.0)
MCH: 30 pg (ref 26.0–34.0)
MCHC: 32.8 g/dL (ref 30.0–36.0)
MCHC: 32.3 g/dL (ref 30.0–36.0)
MCV: 92.9 fL (ref 80.0–100.0)
MCV: 92.9 fL (ref 80.0–100.0)
Platelets: 333 10*3/uL (ref 150–400)
Platelets: 315 10*3/uL (ref 150–400)
RBC: 4.1 MIL/uL (ref 3.87–5.11)
RBC: 4.1 MIL/uL (ref 3.87–5.11)
RDW: 13.7 % (ref 11.5–15.5)
RDW: 13.7 % (ref 11.5–15.5)
WBC: 13.2 10*3/uL — ABNORMAL HIGH (ref 4.0–10.5)
WBC: 14.6 10*3/uL — ABNORMAL HIGH (ref 4.0–10.5)
nRBC: 0 % (ref 0.0–0.2)
nRBC: 0 % (ref 0.0–0.2)

## 2023-01-28 LAB — GLUCOSE, CAPILLARY
Glucose-Capillary: 102 mg/dL — ABNORMAL HIGH (ref 70–99)
Glucose-Capillary: 86 mg/dL (ref 70–99)
Glucose-Capillary: 68 mg/dL — ABNORMAL LOW (ref 70–99)
Glucose-Capillary: 96 mg/dL (ref 70–99)

## 2023-01-28 LAB — RPR: RPR Ser Ql: NONREACTIVE

## 2023-01-28 LAB — TYPE AND SCREEN
ABO/RH(D): B NEG
Antibody Screen: NEGATIVE

## 2023-01-28 MED ORDER — OXYTOCIN-SODIUM CHLORIDE 30-0.9 UT/500ML-% IV SOLN
2.5000 [IU]/h | INTRAVENOUS | Status: DC
  Administered 2023-01-28: 2.5 [IU]/h via INTRAVENOUS
  Filled 2023-01-28: qty 500

## 2023-01-28 MED ORDER — SOD CITRATE-CITRIC ACID 500-334 MG/5ML PO SOLN: 30.0000 mL | ORAL | Status: DC | PRN

## 2023-01-28 MED ORDER — PHENYLEPHRINE 80 MCG/ML (10ML) SYRINGE FOR IV PUSH (FOR BLOOD PRESSURE SUPPORT): 80.0000 ug | PREFILLED_SYRINGE | INTRAVENOUS | Status: DC | PRN

## 2023-01-28 MED ORDER — DIPHENHYDRAMINE HCL 50 MG/ML IJ SOLN: 12.5000 mg | INTRAMUSCULAR | Status: DC | PRN

## 2023-01-28 MED ORDER — EPHEDRINE 5 MG/ML INJ: 10.0000 mg | INTRAVENOUS | Status: DC | PRN

## 2023-01-28 MED ORDER — IBUPROFEN 600 MG PO TABS
600.0000 mg | ORAL_TABLET | Freq: Four times a day (QID) | ORAL | Status: DC
  Administered 2023-01-28 – 2023-01-30 (×7): 600 mg via ORAL
  Filled 2023-01-28 (×7): qty 1

## 2023-01-28 MED ORDER — OXYCODONE HCL 5 MG PO TABS
10.0000 mg | ORAL_TABLET | ORAL | Status: DC | PRN
  Administered 2023-01-30: 10 mg via ORAL
  Filled 2023-01-28: qty 2

## 2023-01-28 MED ORDER — BENZOCAINE-MENTHOL 20-0.5 % EX AERO
1.0000 | INHALATION_SPRAY | CUTANEOUS | Status: DC | PRN
  Administered 2023-01-29: 1 via TOPICAL
  Filled 2023-01-28: qty 56

## 2023-01-28 MED ORDER — ONDANSETRON HCL 4 MG PO TABS: 4.0000 mg | ORAL_TABLET | ORAL | Status: DC | PRN

## 2023-01-28 MED ORDER — OXYTOCIN BOLUS FROM INFUSION
333.0000 mL | Freq: Once | INTRAVENOUS | Status: AC
  Administered 2023-01-28: 333 mL via INTRAVENOUS

## 2023-01-28 MED ORDER — ONDANSETRON HCL 4 MG/2ML IJ SOLN
4.0000 mg | Freq: Four times a day (QID) | INTRAMUSCULAR | Status: DC | PRN
  Administered 2023-01-28: 4 mg via INTRAVENOUS
  Filled 2023-01-28: qty 2

## 2023-01-28 MED ORDER — SODIUM CHLORIDE 0.9 % IV SOLN
5.0000 10*6.[IU] | Freq: Once | INTRAVENOUS | Status: AC
  Administered 2023-01-28: 5 10*6.[IU] via INTRAVENOUS
  Filled 2023-01-28: qty 5

## 2023-01-28 MED ORDER — LACTATED RINGERS IV SOLN
500.0000 mL | Freq: Once | INTRAVENOUS | Status: AC
  Administered 2023-01-28: 500 mL via INTRAVENOUS

## 2023-01-28 MED ORDER — DIBUCAINE (PERIANAL) 1 % EX OINT: 1.0000 | TOPICAL_OINTMENT | CUTANEOUS | Status: DC | PRN

## 2023-01-28 MED ORDER — TRANEXAMIC ACID-NACL 1000-0.7 MG/100ML-% IV SOLN
INTRAVENOUS | Status: AC
  Filled 2023-01-28: qty 100

## 2023-01-28 MED ORDER — DIPHENHYDRAMINE HCL 25 MG PO CAPS: 25.0000 mg | ORAL_CAPSULE | Freq: Four times a day (QID) | ORAL | Status: DC | PRN

## 2023-01-28 MED ORDER — MEASLES, MUMPS & RUBELLA VAC IJ SOLR
0.5000 mL | Freq: Once | INTRAMUSCULAR | Status: AC
  Administered 2023-01-30: 0.5 mL via SUBCUTANEOUS
  Filled 2023-01-28: qty 0.5

## 2023-01-28 MED ORDER — ACETAMINOPHEN 325 MG PO TABS: 650.0000 mg | ORAL_TABLET | ORAL | Status: DC | PRN

## 2023-01-28 MED ORDER — WITCH HAZEL-GLYCERIN EX PADS: 1.0000 | MEDICATED_PAD | CUTANEOUS | Status: DC | PRN

## 2023-01-28 MED ORDER — LIDOCAINE-EPINEPHRINE (PF) 1.5 %-1:200000 IJ SOLN
INTRAMUSCULAR | Status: DC | PRN
Start: 2023-01-28 — End: 2023-01-28
  Administered 2023-01-28: 5 mL via EPIDURAL

## 2023-01-28 MED ORDER — OXYCODONE HCL 5 MG PO TABS
5.0000 mg | ORAL_TABLET | ORAL | Status: DC | PRN
  Administered 2023-01-30: 5 mg via ORAL
  Filled 2023-01-28: qty 1

## 2023-01-28 MED ORDER — ACETAMINOPHEN 325 MG PO TABS
650.0000 mg | ORAL_TABLET | ORAL | Status: DC | PRN
  Administered 2023-01-29 – 2023-01-30 (×6): 650 mg via ORAL
  Filled 2023-01-28 (×7): qty 2

## 2023-01-28 MED ORDER — TRANEXAMIC ACID-NACL 1000-0.7 MG/100ML-% IV SOLN
1000.0000 mg | INTRAVENOUS | Status: AC
  Administered 2023-01-28: 1000 mg via INTRAVENOUS

## 2023-01-28 MED ORDER — FENTANYL-BUPIVACAINE-NACL 0.5-0.125-0.9 MG/250ML-% EP SOLN
12.0000 mL/h | EPIDURAL | Status: DC | PRN
  Administered 2023-01-28: 12 mL/h via EPIDURAL
  Filled 2023-01-28: qty 250

## 2023-01-28 MED ORDER — LIDOCAINE HCL (PF) 1 % IJ SOLN: 30.0000 mL | INTRAMUSCULAR | Status: DC | PRN

## 2023-01-28 MED ORDER — LACTATED RINGERS IV SOLN: INTRAVENOUS | Status: DC

## 2023-01-28 MED ORDER — ONDANSETRON HCL 4 MG/2ML IJ SOLN: 4.0000 mg | INTRAMUSCULAR | Status: DC | PRN

## 2023-01-28 MED ORDER — SIMETHICONE 80 MG PO CHEW: 80.0000 mg | CHEWABLE_TABLET | ORAL | Status: DC | PRN

## 2023-01-28 MED ORDER — SENNOSIDES-DOCUSATE SODIUM 8.6-50 MG PO TABS
2.0000 | ORAL_TABLET | Freq: Every day | ORAL | Status: DC
  Administered 2023-01-29 – 2023-01-30 (×2): 2 via ORAL
  Filled 2023-01-28 (×2): qty 2

## 2023-01-28 MED ORDER — FENTANYL CITRATE (PF) 100 MCG/2ML IJ SOLN: 50.0000 ug | INTRAMUSCULAR | Status: DC | PRN

## 2023-01-28 MED ORDER — COCONUT OIL OIL: 1.0000 | TOPICAL_OIL | Status: DC | PRN

## 2023-01-28 MED ORDER — LACTATED RINGERS IV SOLN: 500.0000 mL | INTRAVENOUS | Status: DC | PRN

## 2023-01-28 MED ORDER — PENICILLIN G POT IN DEXTROSE 60000 UNIT/ML IV SOLN
3.0000 10*6.[IU] | INTRAVENOUS | Status: DC
  Administered 2023-01-28: 3 10*6.[IU] via INTRAVENOUS
  Filled 2023-01-28: qty 50

## 2023-01-28 MED ORDER — ZOLPIDEM TARTRATE 5 MG PO TABS: 5.0000 mg | ORAL_TABLET | Freq: Every evening | ORAL | Status: DC | PRN

## 2023-01-28 MED ORDER — PRENATAL MULTIVITAMIN CH
1.0000 | ORAL_TABLET | Freq: Every day | ORAL | Status: DC
  Administered 2023-01-29 – 2023-01-30 (×2): 1 via ORAL
  Filled 2023-01-28 (×2): qty 1

## 2023-01-28 MED ORDER — PHENYLEPHRINE 80 MCG/ML (10ML) SYRINGE FOR IV PUSH (FOR BLOOD PRESSURE SUPPORT)
80.0000 ug | PREFILLED_SYRINGE | INTRAVENOUS | Status: DC | PRN
  Filled 2023-01-28: qty 10

## 2023-01-28 NOTE — Lactation Note (Signed)
This note was copied from a baby's chart. Lactation Consultation Note  Patient Name: Debbie Mccullough [id number] Date: 01/28/2023 Age:28 hours  Mom's feeding choice is formula.   Maternal Data    Feeding Nipple Type: Slow - flow  LATCH Score                    Lactation Tools Discussed/Used    Interventions    Discharge    Consult Status Consult Status: Complete    Tamiki Kuba G 01/28/2023, 11:08 PM

## 2023-01-28 NOTE — Anesthesia Preprocedure Evaluation (Addendum)
Anesthesia Evaluation  Patient identified by MRN, date of birth, ID band Patient awake    Reviewed: Allergy & Precautions, NPO status , Patient's Chart, lab work & pertinent test results  Airway Mallampati: II  TM Distance: >3 FB Neck ROM: Full    Dental no notable dental hx.    Pulmonary former smoker   Pulmonary exam normal        Cardiovascular hypertension,  Rhythm:Regular Rate:Normal     Neuro/Psych negative neurological ROS     GI/Hepatic negative GI ROS, Neg liver ROS,,,  Endo/Other  diabetes, Type 2, Oral Hypoglycemic Agents    Renal/GU   negative genitourinary   Musculoskeletal negative musculoskeletal ROS (+)    Abdominal Normal abdominal exam  (+)   Peds  Hematology Lab Results      Component                Value               Date                      WBC                      13.2 (H)            01/28/2023                HGB                      12.5                01/28/2023                HCT                      38.1                01/28/2023                MCV                      92.9                01/28/2023                PLT                      333                 01/28/2023              Anesthesia Other Findings   Reproductive/Obstetrics (+) Pregnancy                              Anesthesia Physical Anesthesia Plan  ASA: 2  Anesthesia Plan: Epidural   Post-op Pain Management:    Induction:   PONV Risk Score and Plan: 2 and Treatment may vary due to age or medical condition  Airway Management Planned: Natural Airway  Additional Equipment: None  Intra-op Plan:   Post-operative Plan:   Informed Consent: I have reviewed the patients History and Physical, chart, labs and discussed the procedure including the risks, benefits and alternatives for the proposed anesthesia with the patient or authorized representative who has indicated his/her understanding  and acceptance.     Dental advisory given  Plan Discussed with:   Anesthesia Plan Comments:         Anesthesia Quick Evaluation

## 2023-01-28 NOTE — Anesthesia Procedure Notes (Signed)
Epidural Patient location during procedure: OB Start time: 01/28/2023 1:45 PM End time: 01/28/2023 1:56 PM  Staffing Anesthesiologist: Atilano Median, DO Performed: anesthesiologist   Preanesthetic Checklist Completed: patient identified, IV checked, site marked, risks and benefits discussed, surgical consent, monitors and equipment checked, pre-op evaluation and timeout performed  Epidural Patient position: sitting Prep: ChloraPrep Patient monitoring: heart rate, continuous pulse ox and blood pressure Approach: midline Location: L4-L5 Injection technique: LOR saline  Needle:  Needle type: Tuohy  Needle gauge: 17 G Needle length: 9 cm Needle insertion depth: 8 cm Catheter type: closed end flexible Catheter size: 20 Guage Catheter at skin depth: 13 cm Test dose: negative and 1.5% lidocaine with Epi 1:200 K  Assessment Events: blood not aspirated, no cerebrospinal fluid, injection not painful, no injection resistance and no paresthesia  Additional Notes Patient identified. Risks/Benefits/Options discussed with patient including but not limited to bleeding, infection, nerve damage, paralysis, failed block, incomplete pain control, headache, blood pressure changes, nausea, vomiting, reactions to medications, itching and postpartum back pain. Confirmed with bedside nurse the patient's most recent platelet count. Confirmed with patient that they are not currently taking any anticoagulation, have any bleeding history or any family history of bleeding disorders. Patient expressed understanding and wished to proceed. All questions were answered. Sterile technique was used throughout the entire procedure. Please see nursing notes for vital signs. Test dose was given through epidural catheter and negative prior to continuing to dose epidural or start infusion. Warning signs of high block given to the patient including shortness of breath, tingling/numbness in hands, complete motor block,  or any concerning symptoms with instructions to call for help. Patient was given instructions on fall risk and not to get out of bed. All questions and concerns addressed with instructions to call with any issues or inadequate analgesia.    Reason for block:procedure for pain

## 2023-01-28 NOTE — Discharge Summary (Signed)
Postpartum Discharge Summary     Patient Name: Debbie Mccullough DOB: [birth year] MRN: [id number]  Date of admission: 01/28/2023 Delivery date:01/28/2023 Delivering provider: Celedonio Savage Date of discharge: 01/30/2023  Admitting diagnosis: Gestational diabetes mellitus, antepartum [O24.419] Intrauterine pregnancy: [redacted]w[redacted]d     Secondary diagnosis:  Principal Problem:   Gestational diabetes mellitus, antepartum Active Problems:   GDM (gestational diabetes mellitus)   Supervision of low-risk pregnancy   Rh negative state in antepartum period   [place] non-immune status, antepartum   GBS (group B Streptococcus carrier), +RV culture, currently pregnant   Vaginal delivery  Additional problems: None    Discharge diagnosis: Term Pregnancy Delivered and GDM A2                                              Post partum procedures:rhogam Augmentation: AROM Complications: None  Hospital course: Induction of Labor With Vaginal Delivery   27 y.o. yo [id number] at [redacted]w[redacted]d was admitted to the hospital 01/28/2023 for induction of labor.  Indication for induction: A2 DM.  Patient had an uncomplicated labor course.  Membrane Rupture Time/Date: 6:24 PM,01/28/2023  Delivery Method:Vaginal, Spontaneous Operative Delivery:N/A Episiotomy: None Lacerations:  2nd degree;Perineal Details of delivery can be found in separate delivery note.  Patient had uncomplicated post-partum course.  Patient is discharged home 01/30/23.  Newborn Data: Birth date:01/28/2023 Birth time:6:41 PM Gender:Female Living status:Living Apgars:8 ,9  Weight:2870 g  Magnesium Sulfate received: No BMZ received: No Rhophylac:Yes MMR:Yes T-DaP:Given prenatally Flu: No RSV Vaccine received: No Transfusion:No  Immunizations received: Immunization History  Administered Date(s) Administered   Tdap 11/19/2022    Physical exam  Vitals:   01/29/23 1015 01/29/23 1549 01/29/23 1955 01/30/23 0620  BP: 111/64 113/68 125/79  110/67  Pulse: 70 62 71 80  Resp: 17 18 18 17   Temp: 98.4 F (36.9 C) 98.7 F (37.1 C) 97.9 F (36.6 C) 98 F (36.7 C)  TempSrc: Oral Oral Oral Oral  SpO2: 98% 100% 100% 100%  Weight:      Height:       General: alert, cooperative, and no distress Lochia: appropriate Uterine Fundus: firm Incision: N/A DVT Evaluation: No evidence of DVT seen on physical exam. Negative Homan's sign. No cords or calf tenderness. No significant calf/ankle edema. Labs: Lab Results  Component Value Date   WBC 14.6 (H) 01/28/2023   HGB 12.3 01/28/2023   HCT 38.1 01/28/2023   MCV 92.9 01/28/2023   PLT 315 01/28/2023      Latest Ref Rng & Units 10/26/2022   11:16 AM  CMP  Glucose 70 - 99 mg/dL 92   BUN 6 - 20 mg/dL 3   Creatinine 6.57 - 8.46 mg/dL 9.62   Sodium 952 - 841 mmol/L 135   Potassium 3.5 - 5.2 mmol/L 4.2   Chloride 96 - 106 mmol/L 102   CO2 20 - 29 mmol/L 20   Calcium 8.7 - 10.2 mg/dL 8.9   Total Protein 6.0 - 8.5 g/dL 6.6   Total Bilirubin 0.0 - 1.2 mg/dL <3.2   Alkaline Phos 44 - 121 IU/L 81   AST 0 - 40 IU/L 9   ALT 0 - 32 IU/L 6    Edinburgh Score:    01/28/2023   10:40 PM  Edinburgh Postnatal Depression Scale Screening Tool  I have been able to laugh and see the funny  side of things. 0  I have looked forward with enjoyment to things. 0  I have blamed myself unnecessarily when things went wrong. 1  I have been anxious or worried for no good reason. 1  I have felt scared or panicky for no good reason. 0  Things have been getting on top of me. 1  I have been so unhappy that I have had difficulty sleeping. 1  I have felt sad or miserable. 0  I have been so unhappy that I have been crying. 0  The thought of harming myself has occurred to me. 0  Edinburgh Postnatal Depression Scale Total 4   No data recorded  After visit meds:  Allergies as of 01/30/2023   No Known Allergies      Medication List     STOP taking these medications    aspirin EC 81 MG tablet        TAKE these medications    Accu-Chek Guide w/Device Kit 1 Device by Does not apply route in the morning, at noon, in the evening, and at bedtime.   Accu-Chek Softclix Lancets lancets Use four times daily as instructed.   acetaminophen 325 MG tablet Commonly known as: Tylenol Take 2 tablets (650 mg total) by mouth every 6 (six) hours for 10 days.   glucose blood test strip Use as instructed   ibuprofen 600 MG tablet Commonly known as: ADVIL Take 1 tablet (600 mg total) by mouth every 6 (six) hours.   metFORMIN 500 MG tablet Commonly known as: GLUCOPHAGE Take 1 tablet (500 mg total) by mouth 2 (two) times daily with a meal.   oxyCODONE 5 MG immediate release tablet Commonly known as: Oxy IR/ROXICODONE Take 1 tablet (5 mg total) by mouth every 6 (six) hours as needed for up to 5 days for breakthrough pain (pain scale 4-7).   PrePLUS 27-1 MG Tabs Take 1 tablet by mouth daily.   senna-docusate 8.6-50 MG tablet Commonly known as: Senokot-S Take 2 tablets by mouth daily.         Discharge home in stable condition Infant Feeding:  formula Infant Disposition:home with mother Discharge instruction: per After Visit Summary and Postpartum booklet. Activity: Advance as tolerated. Pelvic rest for 6 weeks.  Diet: routine diet Future Appointments: Future Appointments  Date Time Provider Department Center  03/08/2023  8:20 AM [facility]  03/08/2023 10:35 AM Milas Hock, MD [facility]   Follow up Visit: Message sent   Please schedule this patient for a In person postpartum visit in 4 weeks with the following provider: Any provider. Additional Postpartum F/U:2 hour GTT  High risk pregnancy complicated by: GDM Delivery mode:  Vaginal, Spontaneous Anticipated Birth Control:  Nexplanon   01/30/2023 Hessie Dibble, MD

## 2023-01-28 NOTE — MAU Note (Signed)
.  Debbie Mccullough is a 27 y.o. at [redacted]w[redacted]d here in MAU reporting: Sent over per MFM for CTX's traced during BPP. She reports CTX's since last night. Denies VB or LOF. +FM.   GDM - Metformin but has not picked up her Rx today. GBS+.  Onset of complaint:  Last night Pain score: 10/10 lower abdomen  FHT: 125 initial external Lab orders placed from triage:  MAU Labor Eval

## 2023-01-28 NOTE — H&P (Addendum)
OBSTETRIC ADMISSION HISTORY AND PHYSICAL  Debbie Mccullough is a 27 y.o. female G2P1001 with IUP at [redacted]w[redacted]d by LMP presenting for spontaneous onset of labor. She reports +FMs, No LOF, no VB, no blurry vision, headaches or peripheral edema, and RUQ pain.  She plans on formula feeding. She request nexplanon for birth control. She received her prenatal care at  [facility]    Dating: By LMP --->  Estimated Date of Delivery: 02/08/23  Sono:    @[redacted]w[redacted]d , CWD, normal anatomy, cephalic presentation, 2085g, 27%CWC    Prenatal History/Complications: A2GDM, Rh neg  Past Medical History: Past Medical History:  Diagnosis Date   Bronchitis    Diabetes mellitus without complication (HCC)    Gestational diabetes    Medical history non-contributory    Pyelonephritis    Umbilical hernia     Past Surgical History: Past Surgical History:  Procedure Laterality Date   MULTIPLE TOOTH EXTRACTIONS     NO PAST SURGERIES      Obstetrical History: OB History     Gravida  2   Para  1   Term  1   Preterm      AB      Living  1      SAB      IAB      Ectopic      Multiple  0   Live Births  1           Social History Social History   Socioeconomic History   Marital status: Single    Spouse name: Not on file   Number of children: Not on file   Years of education: Not on file   Highest education level: GED or equivalent  Occupational History   Not on file  Tobacco Use   Smoking status: Former    Current packs/day: 0.00    Average packs/day: 0.3 packs/day for 5.0 years (1.3 ttl pk-yrs)    Types: Cigars, Cigarettes    Start date: 08/18/2011    Quit date: 08/17/2016    Years since quitting: 6.4   Smokeless tobacco: Never   Tobacco comments:    Stopped on her own at + UPT  Vaping Use   Vaping status: Never Used  Substance and Sexual Activity   Alcohol use: No    Alcohol/week: 3.0 standard drinks of alcohol    Types: 3 Cans of beer per week    Comment:  occasionally before pregnancy   Drug use: No    Types: Marijuana    Comment: none since April   Sexual activity: Not Currently    Birth control/protection: None  Other Topics Concern   Not on file  Social History Narrative   Not on file   Social Determinants of Health   Financial Resource Strain: Low Risk  (12/31/2022)   Overall Financial Resource Strain (CARDIA)    Difficulty of Paying Living Expenses: Not very hard  Food Insecurity: No Food Insecurity (12/31/2022)   Hunger Vital Sign    Worried About Running Out of Food in the Last Year: Never true    Ran Out of Food in the Last Year: Never true  Transportation Needs: Unmet Transportation Needs (12/31/2022)   PRAPARE - Transportation    Lack of Transportation (Medical): Yes    Lack of Transportation (Non-Medical): Yes  Physical Activity: Insufficiently Active (12/31/2022)   Exercise Vital Sign    Days of Exercise per Week: 3 days    Minutes of Exercise per Session: 20 min  Stress: No Stress Concern Present (12/31/2022)   [employer] of Occupational Health - Occupational Stress Questionnaire    Feeling of Stress : Only a little  Social Connections: Moderately Isolated (12/31/2022)   Social Connection and Isolation Panel [NHANES]    Frequency of Communication with Friends and Family: More than three times a week    Frequency of Social Gatherings with Friends and Family: Twice a week    Attends Religious Services: 1 to 4 times per year    Active Member of [employer] or Organizations: No    Attends Engineer, structural: Not on file    Marital Status: Never married    Family History: Family History  Problem Relation Age of Onset   Birth defects Maternal Grandfather    Fibroids Mother    Diabetes Maternal Grandmother     Allergies: No Known Allergies  Medications Prior to Admission  Medication Sig Dispense Refill Last Dose   aspirin EC 81 MG tablet Take 1 tablet (81 mg total) by mouth daily. Swallow whole. 30  tablet 12 Past Week   Prenatal Vit-Fe Fumarate-FA (PREPLUS) 27-1 MG TABS Take 1 tablet by mouth daily. 30 tablet 13 Past Week   Accu-Chek Softclix Lancets lancets Use four times daily as instructed. (Patient not taking: Reported on 01/11/2023) 100 each 12    Blood Glucose Monitoring Suppl (ACCU-CHEK GUIDE) w/Device KIT 1 Device by Does not apply route in the morning, at noon, in the evening, and at bedtime. (Patient not taking: Reported on 01/11/2023) 1 kit 0    cefadroxil (DURICEF) 500 MG capsule Take 1 capsule (500 mg total) by mouth 2 (two) times daily. 14 capsule 0    glucose blood test strip Use as instructed (Patient not taking: Reported on 01/11/2023) 100 each 12    metFORMIN (GLUCOPHAGE) 500 MG tablet Take 1 tablet (500 mg total) by mouth 2 (two) times daily with a meal. 60 tablet 5      Review of Systems   All systems reviewed and negative except as stated in HPI  Blood pressure 101/82, pulse 90, temperature 98.6 F (37 C), temperature source Oral, resp. rate 18, height 5\' 2"  (1.575 m), weight 87.2 kg, last menstrual period 05/04/2022, SpO2 99%. General appearance: alert, cooperative, and no distress Lungs: clear to auscultation bilaterally Heart: regular rate and rhythm Abdomen: soft, non-tender; bowel sounds normal Extremities: Homans sign is negative, no sign of DVT Presentation: cephalic Fetal monitoringBaseline: 145 bpm, Variability: Good {> 6 bpm), Accelerations: Reactive, and Decelerations: Variable: mild Uterine activityFrequency: Every 2-3 minutes Dilation: 5 Effacement (%): 70 Station: Ballotable Exam by:: Erle Crocker, RN   Prenatal labs: ABO, Rh: B/Negative/-- (07/08 1116) Antibody: Negative (07/08 1116) Rubella: <0.90 (07/08 1116) RPR: Non Reactive (09/06 1154)  HBsAg: Negative (07/08 1116)  HIV: Non Reactive (09/06 1154)  GBS: Positive/-- (09/26 1214)    Prenatal Transfer Tool  Maternal Diabetes: Yes:  Diabetes Type:  Insulin/Medication  controlled Genetic Screening: Normal Maternal Ultrasounds/Referrals: Normal Maternal Substance Abuse:  No Significant Maternal Medications:  Meds include: Other: Metformin Significant Maternal Lab Results:  Group B Strep positive and Rh negative Number of Prenatal Visits:greater than 3 verified prenatal visits   Results for orders placed or performed during the hospital encounter of 01/28/23 (from the past 24 hour(s))  CBC   Collection Time: 01/28/23 12:30 PM  Result Value Ref Range   WBC 13.2 (H) 4.0 - 10.5 K/uL   RBC 4.10 3.87 - 5.11 MIL/uL   Hemoglobin 12.5 12.0 -  15.0 g/dL   HCT 62.9 52.8 - 41.3 %   MCV 92.9 80.0 - 100.0 fL   MCH 30.5 26.0 - 34.0 pg   MCHC 32.8 30.0 - 36.0 g/dL   RDW 24.4 01.0 - 27.2 %   Platelets 333 150 - 400 K/uL   nRBC 0.0 0.0 - 0.2 %  Type and screen   Collection Time: 01/28/23 12:38 PM  Result Value Ref Range   ABO/RH(D) B NEG    Antibody Screen NEG    Sample Expiration      01/31/2023,2359 Performed at [facility], [address], [place], [place] [address]   Glucose, capillary   Collection Time: 01/28/23 12:42 PM  Result Value Ref Range   Glucose-Capillary 102 (H) 70 - 99 mg/dL    Patient Active Problem List   Diagnosis Date Noted   GBS (group B Streptococcus carrier), +RV culture, currently pregnant 01/18/2023   Rh negative state in antepartum period 10/27/2022   Rubella non-immune status, antepartum 10/27/2022   Supervision of low-risk pregnancy 10/26/2022   Late prenatal care affecting pregnancy in second trimester 10/26/2022   Abdominal wall hernia 10/26/2022   Marijuana abuse 11/02/2016   GDM (gestational diabetes mellitus) 09/17/2016    Assessment/Plan:  Debbie Mccullough is a 27 y.o. G2P1001 at [redacted]w[redacted]d here for spontaneous onset of labor  #Labor:spontaneous onset, plan to monitor cervical change, offer AROM as able, consider augmentation with pitocin #Pain: pending epidural placement. #FWB: Cat II #ID:  GBS positive >  PCN #Rh neg: received Rhogam 11/19/22 #GDM: Monitor CBG Q4H during latent labor and Q2H during active phase of labor  #MOF: Formula feeding #MOC:Nexplanon  This patient's plan of care has been discussed with the attending Dr. Nobie Putnam. Please see attestation.    Charma Igo, MD  01/28/2023, 12:31 PM   Attending Attestation  I saw and evaluated the patient, performing the key elements of the service.I  personally performed or re-performed the history, physical exam, and medical decision making activities of this service and have verified that the service and findings are accurately documented in the resident's note. I developed the management plan that is described in the resident's note, and I agree with the content, with my edits above.    Derrel Nip, MD Attending Family Medicine Physician, [facility] for [facility], [facility]

## 2023-01-29 LAB — GLUCOSE, CAPILLARY: Glucose-Capillary: 126 mg/dL — ABNORMAL HIGH (ref 70–99)

## 2023-01-29 MED ORDER — RHO D IMMUNE GLOBULIN 1500 UNIT/2ML IJ SOSY
300.0000 ug | PREFILLED_SYRINGE | Freq: Once | INTRAMUSCULAR | Status: AC
  Administered 2023-01-29: 300 ug via INTRAVENOUS
  Filled 2023-01-29: qty 2

## 2023-01-29 NOTE — Progress Notes (Signed)
Post Partum Day 1 Subjective: no complaints, up ad lib, voiding and tolerating PO, small lochia, plans to bottle feed,  plans nexplanon  Objective: Blood pressure (!) 120/58, pulse 66, temperature 98 F (36.7 C), temperature source Oral, resp. rate 17, height 5\' 2"  (1.575 m), weight 87.2 kg, last menstrual period 05/04/2022, SpO2 100%, unknown if currently breastfeeding.  Physical Exam:  General: alert, cooperative and no distress Lochia:normal flow Chest: CTAB Heart: RRR no m/r/g Abdomen: +BS, soft, nontender,  Uterine Fundus: firm DVT Evaluation: No evidence of DVT seen on physical exam. Extremities: no edema  Recent Labs    01/28/23 1230 01/28/23 2014  HGB 12.5 12.3  HCT 38.1 38.1    Assessment/Plan: Plan for discharge tomorrow, Circumcision prior to discharge, and Contraception inpt Nexplanon if Monia Pouch is covering   LOS: 1 day   Jacklyn Shell 01/29/2023, 5:37 AM

## 2023-01-29 NOTE — Anesthesia Postprocedure Evaluation (Signed)
Anesthesia Post Note  Patient: Debbie Mccullough  Procedure(s) Performed: AN AD HOC LABOR EPIDURAL     Patient location during evaluation: Mother Baby Anesthesia Type: Epidural Level of consciousness: awake and alert Pain management: pain level controlled Vital Signs Assessment: post-procedure vital signs reviewed and stable Respiratory status: spontaneous breathing, nonlabored ventilation and respiratory function stable Cardiovascular status: stable Postop Assessment: no headache, no backache, epidural receding, no apparent nausea or vomiting, patient able to bend at knees, able to ambulate and adequate PO intake Anesthetic complications: no   No notable events documented.  Last Vitals:  Vitals:   01/29/23 0211 01/29/23 0557  BP: (!) 120/58 114/70  Pulse: 66 60  Resp: 17 17  Temp: 36.7 C (!) 36.3 C  SpO2: 100% 100%    Last Pain:  Vitals:   01/29/23 0802  TempSrc:   PainSc: 0-No pain   Pain Goal:                   [employer]

## 2023-01-30 ENCOUNTER — Other Ambulatory Visit (HOSPITAL_COMMUNITY): Payer: Self-pay

## 2023-01-30 DIAGNOSIS — Z30017 Encounter for initial prescription of implantable subdermal contraceptive: Secondary | ICD-10-CM

## 2023-01-30 LAB — RH IG WORKUP (INCLUDES ABO/RH)
Fetal Screen: NEGATIVE
Gestational Age(Wks): 38
Unit division: 0

## 2023-01-30 MED ORDER — SENNOSIDES-DOCUSATE SODIUM 8.6-50 MG PO TABS
2.0000 | ORAL_TABLET | Freq: Every day | ORAL | 0 refills | Status: AC
  Filled 2023-01-30: qty 15, 7d supply, fill #0

## 2023-01-30 MED ORDER — ACETAMINOPHEN 325 MG PO TABS
650.0000 mg | ORAL_TABLET | Freq: Four times a day (QID) | ORAL | 0 refills | Status: AC
  Filled 2023-01-30: qty 80, 10d supply, fill #0

## 2023-01-30 MED ORDER — IBUPROFEN 600 MG PO TABS
600.0000 mg | ORAL_TABLET | Freq: Four times a day (QID) | ORAL | 0 refills | Status: AC
  Filled 2023-01-30: qty 30, 8d supply, fill #0

## 2023-01-30 MED ORDER — LIDOCAINE HCL 1 % IJ SOLN
0.0000 mL | Freq: Once | INTRAMUSCULAR | Status: AC | PRN
  Administered 2023-01-30: 2.5 mL via INTRADERMAL
  Filled 2023-01-30: qty 20

## 2023-01-30 MED ORDER — OXYCODONE HCL 5 MG PO TABS
5.0000 mg | ORAL_TABLET | Freq: Four times a day (QID) | ORAL | 0 refills | Status: AC | PRN
  Filled 2023-01-30: qty 20, 5d supply, fill #0

## 2023-01-30 MED ORDER — ETONOGESTREL 68 MG ~~LOC~~ IMPL
68.0000 mg | DRUG_IMPLANT | Freq: Once | SUBCUTANEOUS | Status: AC
  Administered 2023-01-30: 68 mg via SUBCUTANEOUS
  Filled 2023-01-30: qty 1

## 2023-01-30 NOTE — Procedures (Addendum)
Immediate Post-Partum Nexplanon Insertion Procedure Note  Patient was identified.Risks and benefits of Nexplanon reviewed.  Informed consent was signed, signed copy in chart. A time-out was performed.    The insertion site was identified 8-10 cm (3-4 inches) from the medial epicondyle of the humerus and 3-5 cm (1.25-2 inches) posterior to (below) the sulcus (groove) between the biceps and triceps muscles of the patient's L arm and marked. The site was prepped and draped in the usual sterile fashion. Pt was prepped with alcohol swab and then injected with 2 cc of 1% lidocaine. She was still feeling sharpness and an additional 3cc of 1% lidocaine was infiltrated. The site was prepped with betadine. Nexplanon removed form packaging,  Device confirmed in needle, then inserted full length of needle and withdrawn per handbook instructions. Provider and patient verified presence of the implant in the woman's arm by palpation. Pt insertion site was covered with steristrips/adhesive bandage and pressure bandage. There was minimal blood loss. Patient tolerated procedure well.  Patient was given post procedure instructions and Nexplanon user card with expiration date. Condoms were recommended for STI prevention. Patient was asked to keep the pressure dressing on for 24 hours to minimize bruising and keep the adhesive bandage on for 3-5 days. The patient verbalized understanding of the plan of care and agrees.   See MAR for medication documentation.   Charma Igo, MD 01/30/23 11:18 AM  GME ATTESTATION:  Evaluation and management procedures were performed by the [facility] Medicine Resident under my supervision. I was immediately available and gloved for direct supervision, assistance and direction throughout this encounter.  I also confirm that I have verified the information documented in the resident's note, and that I have also personally reperformed the pertinent components of the physical exam and all of the  medical decision making activities.  I have also made any necessary editorial changes.  Wyn Forster, MD OB Fellow, Faculty [facility], Center for [facility] 01/30/2023 11:29 AM

## 2023-02-01 ENCOUNTER — Inpatient Hospital Stay (HOSPITAL_COMMUNITY): Admission: RE | Admit: 2023-02-01 | Payer: 59 | Source: Home / Self Care | Admitting: Obstetrics and Gynecology

## 2023-02-01 ENCOUNTER — Encounter: Payer: 59 | Admitting: Obstetrics and Gynecology

## 2023-02-01 ENCOUNTER — Inpatient Hospital Stay (HOSPITAL_COMMUNITY): Payer: 59

## 2023-02-20 ENCOUNTER — Telehealth (HOSPITAL_COMMUNITY): Payer: Self-pay

## 2023-02-20 NOTE — Telephone Encounter (Signed)
02/20/2023 1823  Name: Shandi Godfrey MRN: [id number] DOB: 07/24/1995  Reason for Call:  Transition of Care Hospital Discharge Call  Contact Status: Patient Contact Status: Message  Language assistant needed: Interpreter Mode: Interpreter Not Needed        Follow-Up Questions:    Inocente Salles Postnatal Depression Scale:  In the Past 7 Days:    PHQ2-9 Depression Scale:     Discharge Follow-up:    Post-discharge interventions: NA  Signature  Signe Colt

## 2023-03-02 ENCOUNTER — Other Ambulatory Visit: Payer: Self-pay

## 2023-03-02 DIAGNOSIS — O24415 Gestational diabetes mellitus in pregnancy, controlled by oral hypoglycemic drugs: Secondary | ICD-10-CM

## 2023-03-08 ENCOUNTER — Other Ambulatory Visit: Payer: 59

## 2023-03-08 ENCOUNTER — Ambulatory Visit: Payer: 59 | Admitting: Obstetrics and Gynecology

## 2023-06-28 DIAGNOSIS — U071 COVID-19: Secondary | ICD-10-CM | POA: Diagnosis not present

## 2023-06-28 DIAGNOSIS — R1084 Generalized abdominal pain: Secondary | ICD-10-CM | POA: Diagnosis present

## 2023-06-29 ENCOUNTER — Other Ambulatory Visit: Payer: Self-pay

## 2023-06-29 ENCOUNTER — Emergency Department (HOSPITAL_COMMUNITY)
Admission: EM | Admit: 2023-06-29 | Discharge: 2023-06-29 | Disposition: A | Discharge status: Home / Self Care | Attending: Emergency Medicine | Admitting: Emergency Medicine

## 2023-06-29 ENCOUNTER — Encounter (HOSPITAL_COMMUNITY): Payer: Self-pay | Admitting: Emergency Medicine

## 2023-06-29 DIAGNOSIS — U071 COVID-19: Secondary | ICD-10-CM

## 2023-06-29 LAB — SARS CORONAVIRUS 2 BY RT PCR: SARS Coronavirus 2 by RT PCR: POSITIVE — AB

## 2023-06-29 MED ORDER — IBUPROFEN 400 MG PO TABS
600.0000 mg | ORAL_TABLET | Freq: Once | ORAL | Status: AC
  Administered 2023-06-29: 600 mg via ORAL
  Filled 2023-06-29: qty 1

## 2023-06-29 MED ORDER — ACETAMINOPHEN 325 MG PO TABS
650.0000 mg | ORAL_TABLET | Freq: Once | ORAL | Status: AC | PRN
  Administered 2023-06-29: 650 mg via ORAL
  Filled 2023-06-29: qty 2

## 2023-06-29 MED ORDER — ACETAMINOPHEN 325 MG PO TABS: 650.0000 mg | ORAL_TABLET | Freq: Once | ORAL | Status: DC | PRN

## 2023-06-29 NOTE — ED Provider Notes (Signed)
 [facility] AT [facility] Provider Note   CSN: [id number] Arrival date & time: 06/28/23  2345     History  Chief Complaint  Patient presents with   Chills   Nasal Congestion    Debbie Mccullough is a 28 y.o. female.  28 year old female presents to the emergency department for evaluation of flulike symptoms which began yesterday.  She reports chills as well as a congested cough, headache.  She was noted to have a fever of Tmax 101.30F in triage.  Denies any known sick contacts.  No vomiting or diarrhea, shortness of breath.  The history is provided by the patient. No language interpreter was used.       Home Medications Prior to Admission medications   Medication Sig Start Date End Date Taking? Authorizing Provider  Accu-Chek Softclix Lancets lancets Use four times daily as instructed. 12/30/22   Laton Bing, MD  Blood Glucose Monitoring Suppl (ACCU-CHEK GUIDE) w/Device KIT 1 Device by Does not apply route in the morning, at noon, in the evening, and at bedtime. 12/30/22   [place] Bing, MD  glucose blood test strip Use as instructed Patient not taking: Reported on 01/11/2023 12/30/22    Bing, MD  ibuprofen (ADVIL) 600 MG tablet Take 1 tablet (600 mg total) by mouth every 6 (six) hours. 01/30/23   Hessie Dibble, MD  metFORMIN (GLUCOPHAGE) 500 MG tablet Take 1 tablet (500 mg total) by mouth 2 (two) times daily with a meal. Patient not taking: Reported on 01/29/2023 01/25/23   Sue Lush, FNP  Prenatal Vit-Fe Fumarate-FA (PREPLUS) 27-1 MG TABS Take 1 tablet by mouth daily. 10/26/22   Autry-Lott, Randa Evens, DO  senna-docusate (SENOKOT-S) 8.6-50 MG tablet Take 2 tablets by mouth daily. 01/30/23   Hessie Dibble, MD      Allergies    Patient has no known allergies.    Review of Systems   Review of Systems Ten systems reviewed and are negative for acute change, except as noted in the HPI.    Physical Exam Updated Vital Signs BP  130/78   Pulse 82   Temp 100.2 F (37.9 C) (Temporal)   Resp 19   Ht 5\' 3"  (1.6 m)   Wt 86.2 kg   LMP 06/15/2023   SpO2 98%   BMI 33.66 kg/m   Physical Exam Vitals and nursing note reviewed.  Constitutional:      General: She is not in acute distress.    Appearance: She is well-developed. She is not diaphoretic.     Comments: Appears unwell and fatigued, but nontoxic.  HENT:     Head: Normocephalic and atraumatic.  Eyes:     General: No scleral icterus.    Conjunctiva/sclera: Conjunctivae normal.  Cardiovascular:     Rate and Rhythm: Normal rate and regular rhythm.     Comments: Not tachycardic as noted in triage Pulmonary:     Effort: Pulmonary effort is normal. No respiratory distress.     Comments: Respirations even and unlabored. Congested, nonproductive cough noted at bedside. Musculoskeletal:        General: Normal range of motion.     Cervical back: Normal range of motion.  Skin:    General: Skin is warm and dry.     Coloration: Skin is not pale.     Findings: No erythema or rash.  Neurological:     Mental Status: She is alert and oriented to person, place, and time.     Coordination: Coordination  normal.  Psychiatric:        Behavior: Behavior normal.     ED Results / Procedures / Treatments   Labs (all labs ordered are listed, but only abnormal results are displayed) Labs Reviewed  SARS CORONAVIRUS 2 BY RT PCR - Abnormal; Notable for the following components:      Result Value   SARS Coronavirus 2 by RT PCR POSITIVE (*)    All other components within normal limits    EKG None  Radiology No results found.  Procedures Procedures    Medications Ordered in ED Medications  acetaminophen (TYLENOL) tablet 650 mg (has no administration in time range)  acetaminophen (TYLENOL) tablet 650 mg (650 mg Oral Given 06/29/23 0035)  ibuprofen (ADVIL) tablet 600 mg (600 mg Oral Given 06/29/23 0841)    ED Course/ Medical Decision Making/ A&P                                  Medical Decision Making Risk OTC drugs.   This patient presents to the ED for concern of flu-like symptoms, this involves an extensive number of treatment options, and is a complaint that carries with it a high risk of complications and morbidity.  The differential diagnosis includes influenza vs COVID vs PNA vs other viral syndrome   Co morbidities that complicate the patient evaluation  Gestational DM   Additional history obtained:  External records from outside source obtained and reviewed including L&D summary from 01/28/23   Lab Tests:  I Ordered, and personally interpreted labs.  The pertinent results include:  COVID positive   Cardiac Monitoring:  The patient was maintained on a cardiac monitor.  I personally viewed and interpreted the cardiac monitored which showed an underlying rhythm of: NSR   Medicines ordered and prescription drug management:  I ordered medication including APAP and ibuprofen for fever  Reevaluation of the patient after these medicines showed that the patient improved I have reviewed the patients home medicines and have made adjustments as needed   Test Considered:  CXR   Problem List / ED Course:  Flu like symptoms x 2 days. COVID positive. No respiratory distress, hypoxemia. Fever responding to antipyretics. Encouraged rest and PO hydration, continuation of OTC analgesics. Stable for outpatient f/u with PCP for recheck PRN.   Reevaluation:  After the interventions noted above, I reevaluated the patient and found that they have : remained stable   Social Determinants of Health:  5 months postpartum   Dispostion:  After consideration of the diagnostic results and the patients response to treatment, I feel that the patent would benefit from outpatient supportive care and PCP f/u. Discussed expected course of COVID illness, the need to mask at home. Return precautions discussed and provided. Patient discharged in  stable condition with no unaddressed concerns.          Final Clinical Impression(s) / ED Diagnoses Final diagnoses:  COVID-19 virus infection    Rx / DC Orders ED Discharge Orders     None         Antony Madura, PA-C 06/29/23 1610    Gerhard Munch, MD 06/29/23 1525

## 2023-06-29 NOTE — ED Triage Notes (Signed)
 Pt reports flu symptoms that started today. C/o chills, congestion, headache. Denies sick contacts.

## 2023-06-29 NOTE — ED Notes (Signed)
 Pt was here with her baby and didn't have a ride home so I gave her a taxi voucher

## 2023-06-29 NOTE — Discharge Instructions (Signed)
 Your COVID test today is positive. We recommend that you mask and quarantine away from other individuals for the next 3-4 days. Take tylenol for fever and ibuprofen for headaches, body aches. Drink plenty of fluids to prevent dehydration. You may continue to use other over-the-counter remedies for symptom control, if desired. Return for new or concerning symptoms such as worsening shortness of breath, coughing up blood, persistent vomiting, loss of consciousness.

## 2023-06-30 IMAGING — CT CT ABD-PELV W/ CM
2 of 4 series · 16 of 46 positions shown, 18 images · IV contrast (APPLIED)
Comparison: CT abdomen and pelvis 09/06/2020.

CLINICAL DATA: Nausea and vomiting.

EXAM:
CT ABDOMEN AND PELVIS WITH CONTRAST
TECHNIQUE: Multidetector CT imaging of the abdomen and pelvis was performed
using the standard protocol following bolus administration of
intravenous contrast.
CONTRAST:  100mL OMNIPAQUE IOHEXOL 300 MG/ML  SOLN

[Series 3: abdomen 5.0 · axial · 0.83mm/px · z∈[+912,+1282]mm · 13 of 84 slices shown, 15 images]
[im 5/84  soft-tissue]
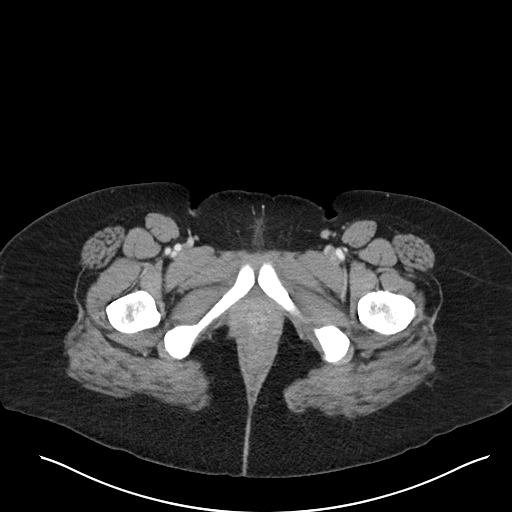
[im 5/84  bone]
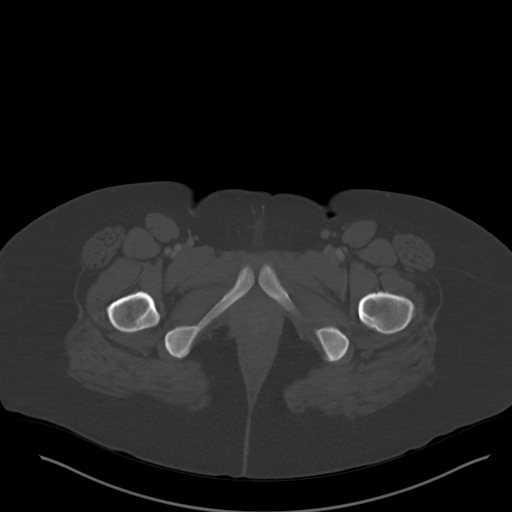
[im 10/84  soft-tissue]
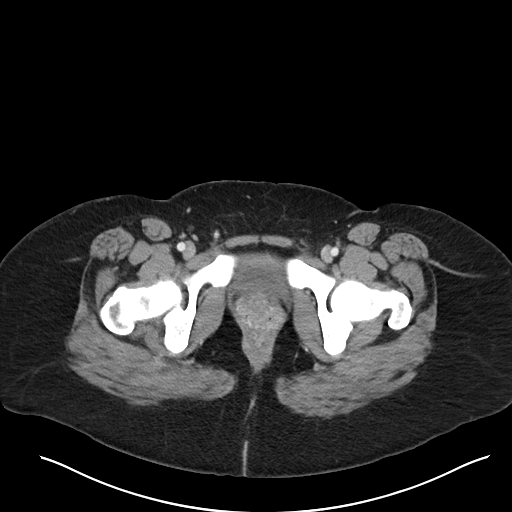
[im 20/84  soft-tissue]
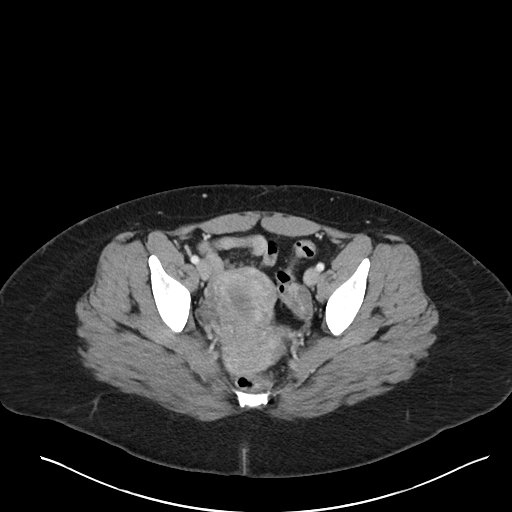
[im 25/84  soft-tissue]
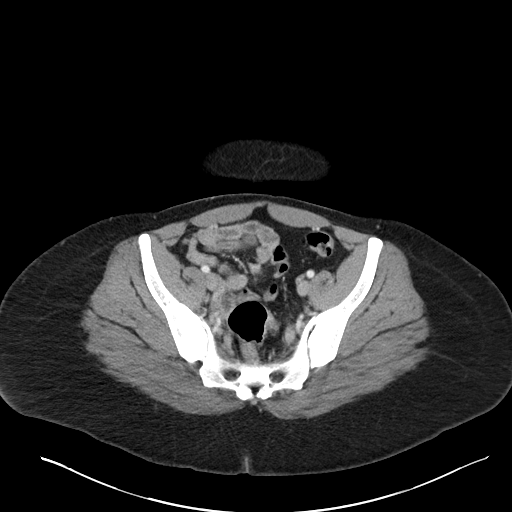
[im 30/84  soft-tissue]
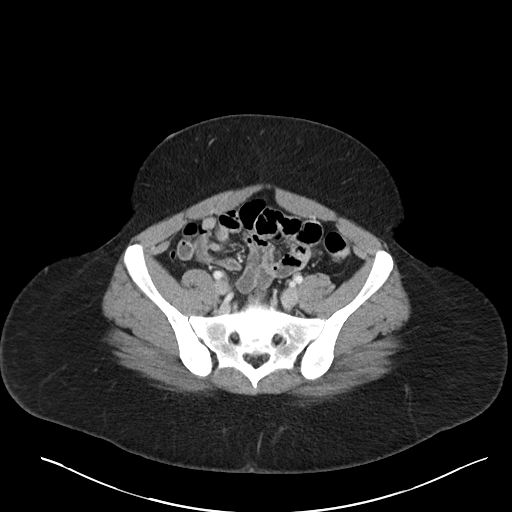
[im 35/84  soft-tissue]
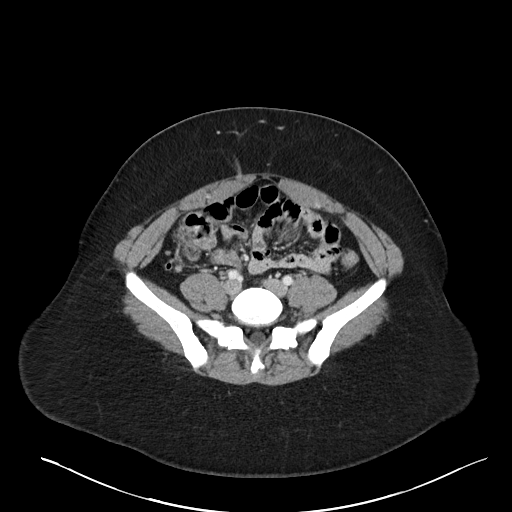
[im 44/84  soft-tissue]
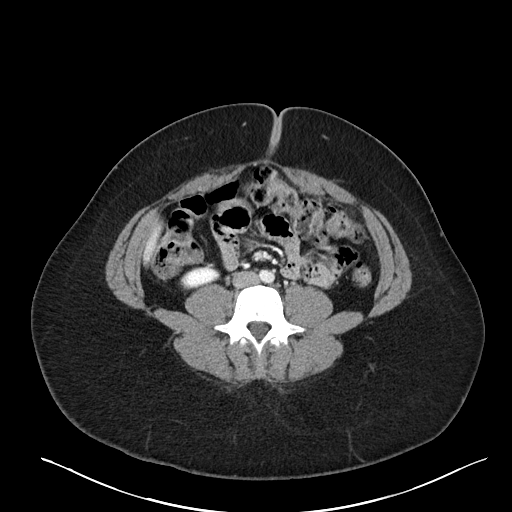
[im 49/84  soft-tissue]
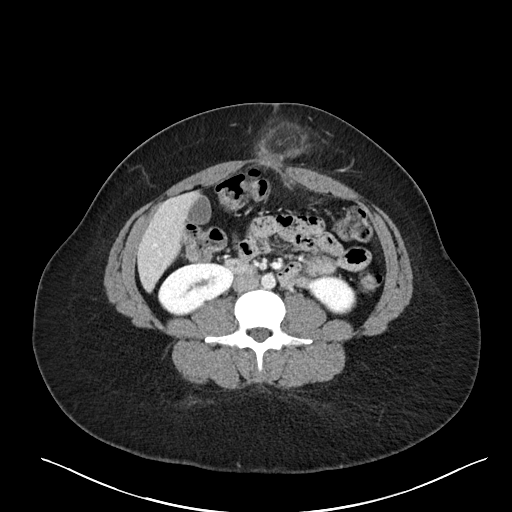
[im 54/84  soft-tissue]
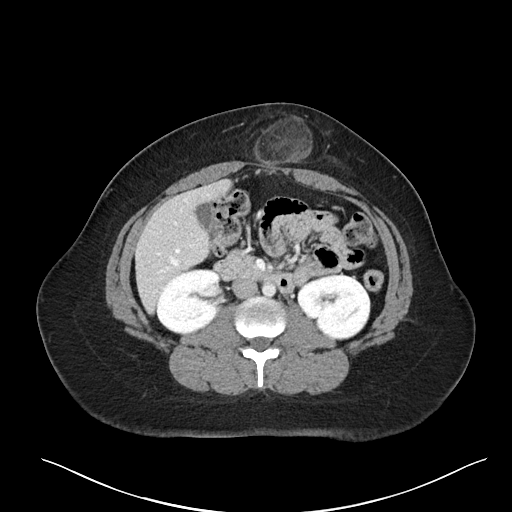
[im 54/84  bone]
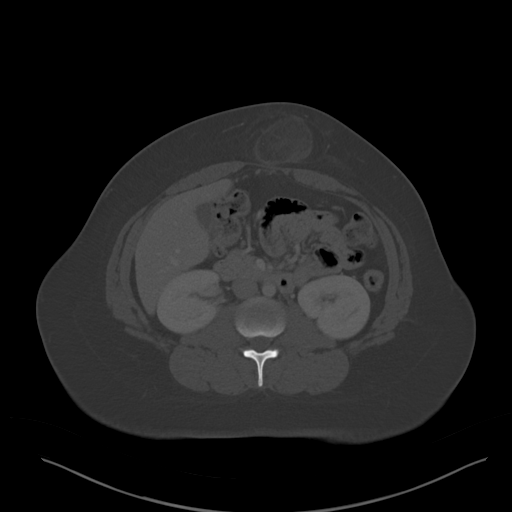
[im 59/84  soft-tissue]
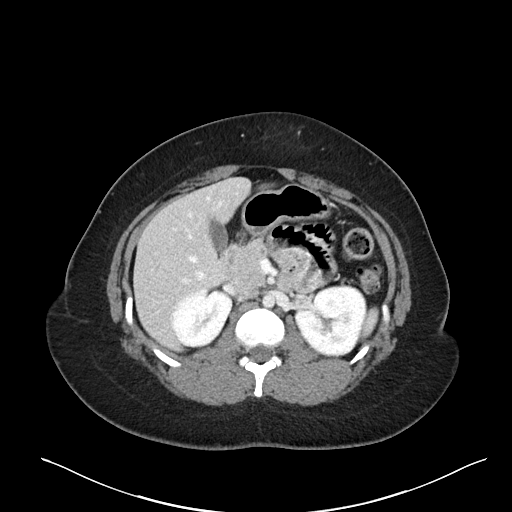
[im 64/84  soft-tissue]
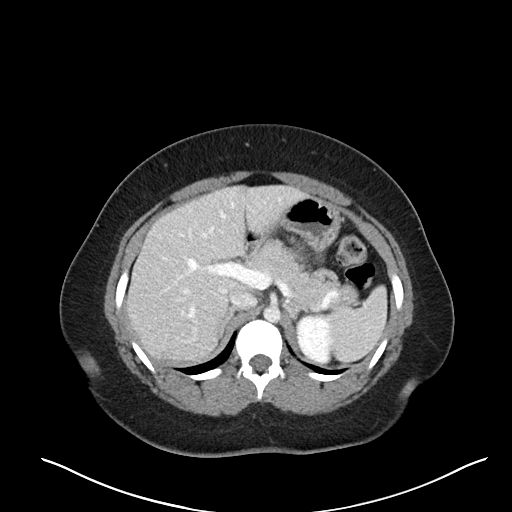
[im 74/84  soft-tissue]
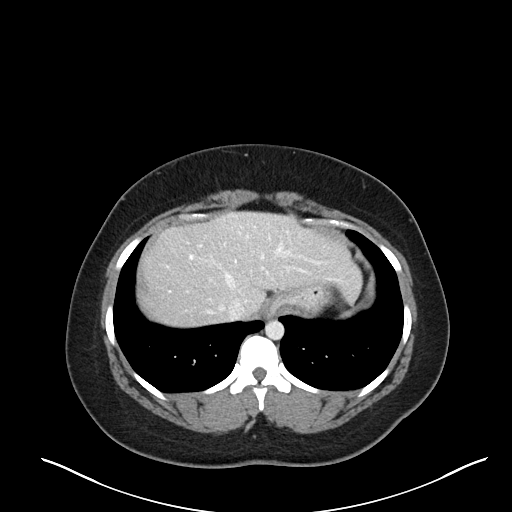
[im 79/84  soft-tissue]
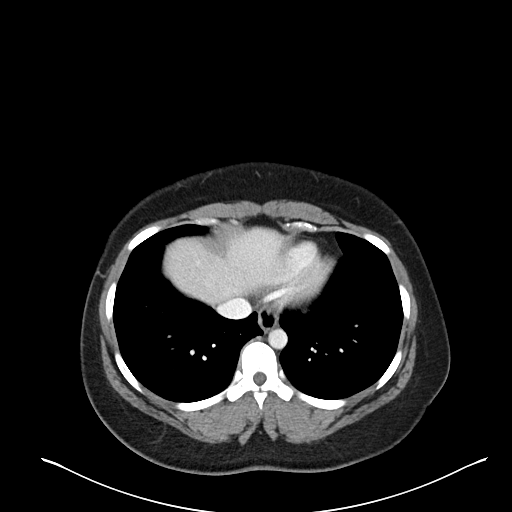

[Series 6: abdomen 3.0 mpr cor · coronal · 0.66mm/px · 3 of 106 slices shown]
[im 36/106  soft-tissue]
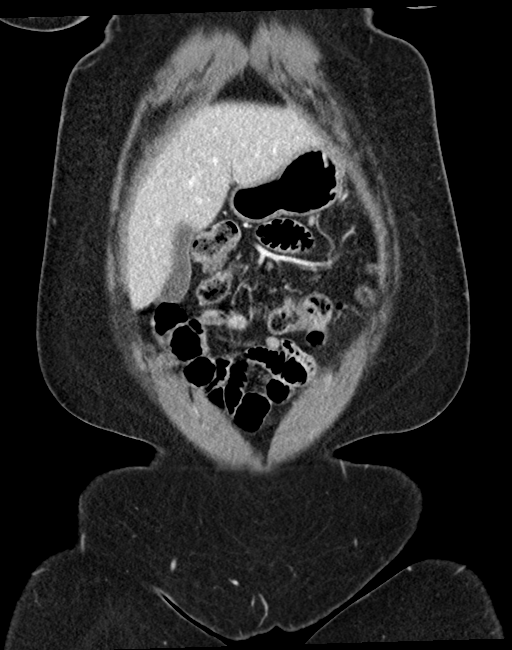
[im 47/106  soft-tissue]
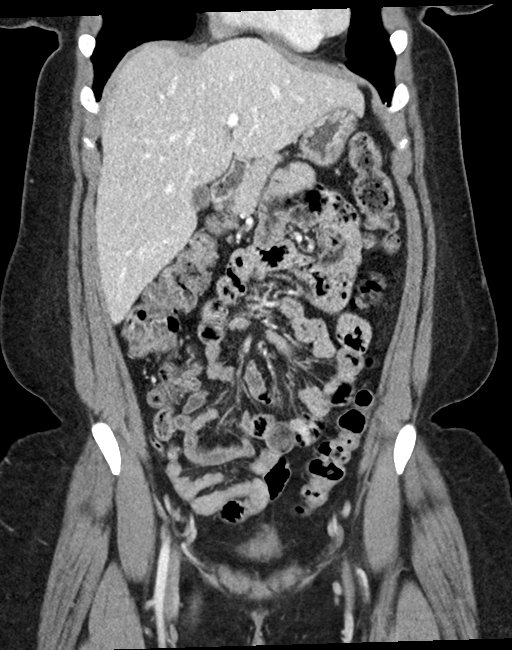
[im 59/106  soft-tissue]
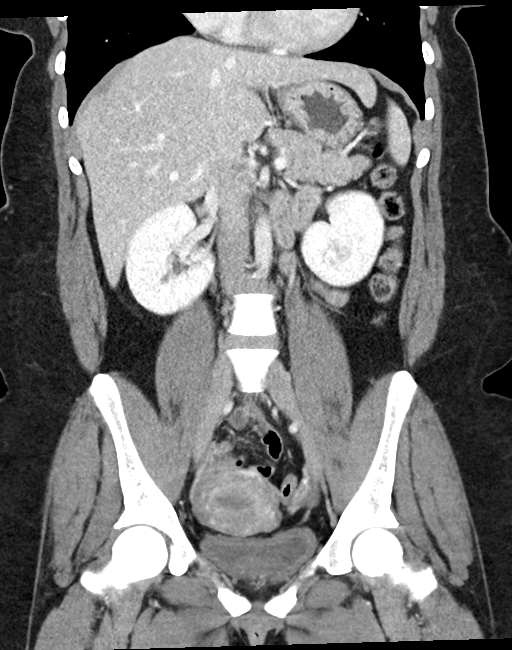

[16 of 46 positions shown; findings below may reference images not displayed]

FINDINGS: Lower chest: Minimal multifocal ground-glass opacities are seen in
both lung bases, likely infectious/inflammatory.

Hepatobiliary: No focal liver abnormality is seen. No gallstones,
gallbladder wall thickening, or biliary dilatation.

Pancreas: Unremarkable. No pancreatic ductal dilatation or
surrounding inflammatory changes.

Spleen: Normal in size without focal abnormality.

Adrenals/Urinary Tract: Adrenal glands are unremarkable. Kidneys are
normal, without renal calculi, focal lesion, or hydronephrosis.
There is mild diffuse bladder wall thickening versus normal under
distension.

Stomach/Bowel: Stomach is within normal limits. Appendix appears
normal. No evidence of bowel wall thickening, distention, or
inflammatory changes.

Vascular/Lymphatic: No significant vascular findings are present. No
enlarged abdominal or pelvic lymph nodes.

Reproductive: There is likely corpus luteum cyst in the left ovary.
There is a small enhancing area in the left uterus measuring 8 mm,
likely a fibroid. The right ovary is within normal limits.

Other: Again seen is a supraumbilical fat containing ventral hernia.
Compared to the prior examination. The hernia sac has increased in
size now measuring 4.7 by 4.6 by 4.4 cm. Wall defect measures 2.0 by
1.5 cm. There is new fat stranding within the hernia sac. There is
no ascites or free air.

Musculoskeletal: No acute or significant osseous findings.
IMPRESSION: 1. Moderate-sized supraumbilical ventral hernia has increased in
size. The hernia contains fat (either mesenteric or omental) and
inflammation. Findings can be seen in the setting of infection or
omental infarct.
2. Bladder wall thickening versus normal under distension. Correlate
for cystitis.
3. Likely small left uterine fibroid.
